# Patient Record
Sex: Female | Born: 1950 | Race: Black or African American | Hispanic: No | Marital: Married
Health system: Southern US, Community
[De-identification: ages and names within clinical notes are randomized; demographics above are authoritative.]

---

## 2005-07-26 ENCOUNTER — Encounter: Admission: RE | Admit: 2005-07-26 | Discharge: 2005-07-26 | Payer: Self-pay | Admitting: General Practice

## 2005-07-26 ENCOUNTER — Encounter (INDEPENDENT_AMBULATORY_CARE_PROVIDER_SITE_OTHER): Payer: Self-pay | Admitting: *Deleted

## 2005-07-26 ENCOUNTER — Encounter (INDEPENDENT_AMBULATORY_CARE_PROVIDER_SITE_OTHER): Payer: Self-pay | Admitting: Diagnostic Radiology

## 2005-08-07 ENCOUNTER — Encounter: Admission: RE | Admit: 2005-08-07 | Discharge: 2005-08-07 | Payer: Self-pay | Admitting: General Practice

## 2005-08-20 ENCOUNTER — Ambulatory Visit (HOSPITAL_COMMUNITY): Admission: RE | Admit: 2005-08-20 | Discharge: 2005-08-21 | Payer: Self-pay | Admitting: General Surgery

## 2005-08-20 ENCOUNTER — Encounter (INDEPENDENT_AMBULATORY_CARE_PROVIDER_SITE_OTHER): Payer: Self-pay | Admitting: *Deleted

## 2005-08-21 ENCOUNTER — Ambulatory Visit: Payer: Self-pay | Admitting: Oncology

## 2005-09-07 ENCOUNTER — Ambulatory Visit (HOSPITAL_COMMUNITY): Admission: RE | Admit: 2005-09-07 | Discharge: 2005-09-07 | Payer: Self-pay | Admitting: Oncology

## 2005-09-17 ENCOUNTER — Ambulatory Visit (HOSPITAL_COMMUNITY): Admission: RE | Admit: 2005-09-17 | Discharge: 2005-09-17 | Payer: Self-pay | Admitting: Oncology

## 2005-09-18 ENCOUNTER — Ambulatory Visit: Admission: RE | Admit: 2005-09-18 | Discharge: 2005-09-18 | Payer: Self-pay | Admitting: Oncology

## 2005-09-18 ENCOUNTER — Encounter (INDEPENDENT_AMBULATORY_CARE_PROVIDER_SITE_OTHER): Payer: Self-pay | Admitting: Cardiology

## 2005-10-02 ENCOUNTER — Encounter: Admission: RE | Admit: 2005-10-02 | Discharge: 2005-12-31 | Payer: Self-pay | Admitting: General Surgery

## 2005-10-08 ENCOUNTER — Ambulatory Visit: Payer: Self-pay | Admitting: Oncology

## 2005-10-19 ENCOUNTER — Ambulatory Visit (HOSPITAL_COMMUNITY): Admission: RE | Admit: 2005-10-19 | Discharge: 2005-10-19 | Payer: Self-pay | Admitting: Oncology

## 2005-10-23 ENCOUNTER — Inpatient Hospital Stay (HOSPITAL_COMMUNITY): Admission: EM | Admit: 2005-10-23 | Discharge: 2005-10-29 | Payer: Self-pay | Admitting: Oncology

## 2005-10-24 ENCOUNTER — Ambulatory Visit: Payer: Self-pay | Admitting: Oncology

## 2005-11-16 ENCOUNTER — Observation Stay (HOSPITAL_COMMUNITY): Admission: EM | Admit: 2005-11-16 | Discharge: 2005-11-16 | Payer: Self-pay | Admitting: Oncology

## 2005-11-26 ENCOUNTER — Ambulatory Visit: Payer: Self-pay | Admitting: Oncology

## 2005-12-10 ENCOUNTER — Ambulatory Visit (HOSPITAL_COMMUNITY): Admission: RE | Admit: 2005-12-10 | Discharge: 2005-12-10 | Payer: Self-pay | Admitting: Oncology

## 2005-12-28 ENCOUNTER — Ambulatory Visit (HOSPITAL_COMMUNITY): Admission: RE | Admit: 2005-12-28 | Discharge: 2005-12-28 | Payer: Self-pay | Admitting: Surgery

## 2005-12-28 ENCOUNTER — Encounter (INDEPENDENT_AMBULATORY_CARE_PROVIDER_SITE_OTHER): Payer: Self-pay | Admitting: *Deleted

## 2006-01-16 ENCOUNTER — Ambulatory Visit: Payer: Self-pay | Admitting: Oncology

## 2006-01-22 ENCOUNTER — Encounter: Admission: RE | Admit: 2006-01-22 | Discharge: 2006-03-22 | Payer: Self-pay | Admitting: General Surgery

## 2006-02-08 ENCOUNTER — Ambulatory Visit: Admission: RE | Admit: 2006-02-08 | Discharge: 2006-02-08 | Payer: Self-pay | Admitting: Oncology

## 2006-02-21 LAB — CBC WITH DIFFERENTIAL/PLATELET
BASO%: 2.1 % — ABNORMAL HIGH (ref 0.0–2.0)
EOS%: 5.6 % (ref 0.0–7.0)
LYMPH%: 74.8 % — ABNORMAL HIGH (ref 14.0–48.0)
MCH: 32.9 pg (ref 26.0–34.0)
MCHC: 35.6 g/dL (ref 32.0–36.0)
MONO#: 0.1 10*3/uL (ref 0.1–0.9)
MONO%: 5.3 % (ref 0.0–13.0)
Platelets: 64 10*3/uL — ABNORMAL LOW (ref 145–400)
RBC: 4.38 10*6/uL (ref 3.70–5.32)
WBC: 1.3 10*3/uL — ABNORMAL LOW (ref 3.9–10.0)

## 2006-02-21 LAB — PROTIME-INR: INR: 3.5 (ref 2.00–3.50)

## 2006-02-28 LAB — PROTIME-INR
INR: 3.4 (ref 2.00–3.50)
Protime: 22.3 Seconds — ABNORMAL HIGH (ref 10.6–13.4)

## 2006-02-28 LAB — CBC WITH DIFFERENTIAL/PLATELET
BASO%: 0.9 % (ref 0.0–2.0)
EOS%: 0.5 % (ref 0.0–7.0)
HCT: 41.6 % (ref 34.8–46.6)
MCHC: 34.2 g/dL (ref 32.0–36.0)
MONO#: 1 10*3/uL — ABNORMAL HIGH (ref 0.1–0.9)
RBC: 4.39 10*6/uL (ref 3.70–5.32)
RDW: 15.1 % — ABNORMAL HIGH (ref 11.3–14.5)
WBC: 9.1 10*3/uL (ref 3.9–10.0)
lymph#: 1.8 10*3/uL (ref 0.9–3.3)

## 2006-03-05 ENCOUNTER — Ambulatory Visit: Admission: RE | Admit: 2006-03-05 | Discharge: 2006-05-07 | Payer: Self-pay | Admitting: Radiation Oncology

## 2006-03-06 ENCOUNTER — Ambulatory Visit: Payer: Self-pay | Admitting: Oncology

## 2006-03-06 LAB — CBC WITH DIFFERENTIAL/PLATELET
BASO%: 0.8 % (ref 0.0–2.0)
EOS%: 0.5 % (ref 0.0–7.0)
MCH: 32.8 pg (ref 26.0–34.0)
MCHC: 34.3 g/dL (ref 32.0–36.0)
MONO#: 1.1 10*3/uL — ABNORMAL HIGH (ref 0.1–0.9)
RBC: 4.22 10*6/uL (ref 3.70–5.32)
WBC: 9 10*3/uL (ref 3.9–10.0)
lymph#: 1.8 10*3/uL (ref 0.9–3.3)

## 2006-03-06 LAB — PROTIME-INR: Protime: 14 Seconds — ABNORMAL HIGH (ref 10.6–13.4)

## 2006-03-14 LAB — CBC WITH DIFFERENTIAL/PLATELET
BASO%: 0.3 % (ref 0.0–2.0)
EOS%: 2.5 % (ref 0.0–7.0)
LYMPH%: 16.2 % (ref 14.0–48.0)
MCHC: 33.7 g/dL (ref 32.0–36.0)
MCV: 95.3 fL (ref 81.0–101.0)
MONO%: 10.4 % (ref 0.0–13.0)
Platelets: 232 10*3/uL (ref 145–400)
RBC: 4.65 10*6/uL (ref 3.70–5.32)
RDW: 16.3 % — ABNORMAL HIGH (ref 11.3–14.5)

## 2006-03-14 LAB — COMPREHENSIVE METABOLIC PANEL
Albumin: 4 g/dL (ref 3.5–5.2)
Alkaline Phosphatase: 106 U/L (ref 39–117)
BUN: 16 mg/dL (ref 6–23)
Creatinine, Ser: 0.8 mg/dL (ref 0.4–1.2)
Glucose, Bld: 96 mg/dL (ref 70–99)
Total Bilirubin: 0.4 mg/dL (ref 0.3–1.2)

## 2006-03-14 LAB — PROTIME-INR
INR: 1.7 — ABNORMAL LOW (ref 2.00–3.50)
Protime: 16.2 Seconds — ABNORMAL HIGH (ref 10.6–13.4)

## 2006-03-21 LAB — CBC WITH DIFFERENTIAL/PLATELET
BASO%: 0.9 % (ref 0.0–2.0)
EOS%: 6 % (ref 0.0–7.0)
HCT: 42.2 % (ref 34.8–46.6)
LYMPH%: 17.8 % (ref 14.0–48.0)
MCH: 33 pg (ref 26.0–34.0)
MCHC: 35.1 g/dL (ref 32.0–36.0)
MONO#: 0.7 10*3/uL (ref 0.1–0.9)
NEUT%: 65.9 % (ref 39.6–76.8)
Platelets: 241 10*3/uL (ref 145–400)

## 2006-03-28 LAB — CBC WITH DIFFERENTIAL/PLATELET
BASO%: 0.4 % (ref 0.0–2.0)
Basophils Absolute: 0.1 10*3/uL (ref 0.0–0.1)
HCT: 43.6 % (ref 34.8–46.6)
LYMPH%: 9.9 % — ABNORMAL LOW (ref 14.0–48.0)
MCHC: 36.3 g/dL — ABNORMAL HIGH (ref 32.0–36.0)
MONO#: 0.8 10*3/uL (ref 0.1–0.9)
NEUT%: 78.4 % — ABNORMAL HIGH (ref 39.6–76.8)
Platelets: 200 10*3/uL (ref 145–400)
WBC: 11.3 10*3/uL — ABNORMAL HIGH (ref 3.9–10.0)

## 2006-04-04 LAB — CBC WITH DIFFERENTIAL/PLATELET
Basophils Absolute: 0.1 10*3/uL (ref 0.0–0.1)
Eosinophils Absolute: 0.4 10*3/uL (ref 0.0–0.5)
HCT: 43.7 % (ref 34.8–46.6)
HGB: 15 g/dL (ref 11.6–15.9)
LYMPH%: 10.5 % — ABNORMAL LOW (ref 14.0–48.0)
MONO#: 0.8 10*3/uL (ref 0.1–0.9)
NEUT#: 7.5 10*3/uL — ABNORMAL HIGH (ref 1.5–6.5)
NEUT%: 77.2 % — ABNORMAL HIGH (ref 39.6–76.8)
Platelets: 193 10*3/uL (ref 145–400)
WBC: 9.7 10*3/uL (ref 3.9–10.0)

## 2006-04-04 LAB — PROTIME-INR: INR: 1.4 — ABNORMAL LOW (ref 2.00–3.50)

## 2006-04-11 LAB — CBC WITH DIFFERENTIAL/PLATELET
BASO%: 0.9 % (ref 0.0–2.0)
Basophils Absolute: 0.1 10*3/uL (ref 0.0–0.1)
EOS%: 4.3 % (ref 0.0–7.0)
HGB: 15.3 g/dL (ref 11.6–15.9)
MCH: 31.9 pg (ref 26.0–34.0)
MONO%: 10.1 % (ref 0.0–13.0)
RBC: 4.78 10*6/uL (ref 3.70–5.32)
RDW: 13.2 % (ref 11.3–14.5)
lymph#: 0.9 10*3/uL (ref 0.9–3.3)

## 2006-04-11 LAB — PROTIME-INR
INR: 1.5 — ABNORMAL LOW (ref 2.00–3.50)
Protime: 15.1 Seconds — ABNORMAL HIGH (ref 10.6–13.4)

## 2006-04-13 ENCOUNTER — Ambulatory Visit: Payer: Self-pay | Admitting: Oncology

## 2006-04-18 LAB — CBC WITH DIFFERENTIAL/PLATELET
Basophils Absolute: 0 10*3/uL (ref 0.0–0.1)
Eosinophils Absolute: 0.2 10*3/uL (ref 0.0–0.5)
HGB: 15.2 g/dL (ref 11.6–15.9)
MONO#: 0.6 10*3/uL (ref 0.1–0.9)
NEUT#: 5 10*3/uL (ref 1.5–6.5)
RBC: 4.84 10*6/uL (ref 3.70–5.32)
RDW: 12.6 % (ref 11.3–14.5)
WBC: 7 10*3/uL (ref 3.9–10.0)
lymph#: 1.1 10*3/uL (ref 0.9–3.3)

## 2006-04-18 LAB — PROTIME-INR
INR: 2 (ref 2.00–3.50)
Protime: 17.3 Seconds — ABNORMAL HIGH (ref 10.6–13.4)

## 2006-04-25 LAB — CBC WITH DIFFERENTIAL/PLATELET
BASO%: 0.6 % (ref 0.0–2.0)
Eosinophils Absolute: 0.2 10*3/uL (ref 0.0–0.5)
HCT: 46.2 % (ref 34.8–46.6)
LYMPH%: 17.7 % (ref 14.0–48.0)
MCHC: 33.8 g/dL (ref 32.0–36.0)
MONO#: 0.7 10*3/uL (ref 0.1–0.9)
NEUT#: 4.9 10*3/uL (ref 1.5–6.5)
Platelets: 193 10*3/uL (ref 145–400)
RBC: 4.84 10*6/uL (ref 3.70–5.32)
WBC: 7.1 10*3/uL (ref 3.9–10.0)
lymph#: 1.3 10*3/uL (ref 0.9–3.3)

## 2006-04-25 LAB — PROTIME-INR: Protime: 17.9 Seconds — ABNORMAL HIGH (ref 10.6–13.4)

## 2006-05-01 ENCOUNTER — Encounter: Admission: RE | Admit: 2006-05-01 | Discharge: 2006-05-01 | Payer: Self-pay | Admitting: Oncology

## 2006-05-02 LAB — PROTIME-INR
INR: 2.4 (ref 2.00–3.50)
Protime: 18.7 Seconds — ABNORMAL HIGH (ref 10.6–13.4)

## 2006-05-06 ENCOUNTER — Encounter: Admission: RE | Admit: 2006-05-06 | Discharge: 2006-06-26 | Payer: Self-pay | Admitting: Radiation Oncology

## 2006-05-08 LAB — COMPREHENSIVE METABOLIC PANEL
AST: 18 U/L (ref 0–37)
Albumin: 3.8 g/dL (ref 3.5–5.2)
Alkaline Phosphatase: 132 U/L — ABNORMAL HIGH (ref 39–117)
BUN: 12 mg/dL (ref 6–23)
Potassium: 3.4 mEq/L — ABNORMAL LOW (ref 3.5–5.3)
Sodium: 139 mEq/L (ref 135–145)

## 2006-05-08 LAB — CBC WITH DIFFERENTIAL/PLATELET
Eosinophils Absolute: 0.3 10*3/uL (ref 0.0–0.5)
MONO#: 0.5 10*3/uL (ref 0.1–0.9)
MONO%: 7.7 % (ref 0.0–13.0)
NEUT#: 4.5 10*3/uL (ref 1.5–6.5)
RBC: 4.79 10*6/uL (ref 3.70–5.32)
RDW: 13.9 % (ref 11.3–14.5)
WBC: 6.9 10*3/uL (ref 3.9–10.0)
lymph#: 1.5 10*3/uL (ref 0.9–3.3)

## 2006-05-08 LAB — PROTIME-INR
INR: 1.7 — ABNORMAL LOW (ref 2.00–3.50)
Protime: 15.9 Seconds — ABNORMAL HIGH (ref 10.6–13.4)

## 2006-05-09 LAB — FOLLICLE STIMULATING HORMONE: FSH: 65.2 m[IU]/mL

## 2006-05-13 LAB — ESTRADIOL, ULTRA SENS: Estradiol, Ultra Sensitive: 10 pg/mL

## 2006-05-28 ENCOUNTER — Ambulatory Visit (HOSPITAL_BASED_OUTPATIENT_CLINIC_OR_DEPARTMENT_OTHER): Admission: RE | Admit: 2006-05-28 | Discharge: 2006-05-28 | Payer: Self-pay | Admitting: General Surgery

## 2006-07-18 ENCOUNTER — Ambulatory Visit: Payer: Self-pay | Admitting: Oncology

## 2006-07-19 LAB — CBC WITH DIFFERENTIAL/PLATELET
Basophils Absolute: 0 10*3/uL (ref 0.0–0.1)
Eosinophils Absolute: 0.2 10*3/uL (ref 0.0–0.5)
HCT: 45 % (ref 34.8–46.6)
HGB: 15.4 g/dL (ref 11.6–15.9)
MCH: 31.4 pg (ref 26.0–34.0)
NEUT#: 5.7 10*3/uL (ref 1.5–6.5)
NEUT%: 74.2 % (ref 39.6–76.8)
RDW: 13.7 % (ref 11.3–14.5)
lymph#: 1.4 10*3/uL (ref 0.9–3.3)

## 2006-07-19 LAB — COMPREHENSIVE METABOLIC PANEL
Alkaline Phosphatase: 122 U/L — ABNORMAL HIGH (ref 39–117)
BUN: 11 mg/dL (ref 6–23)
CO2: 19 mEq/L (ref 19–32)
Glucose, Bld: 120 mg/dL — ABNORMAL HIGH (ref 70–99)
Total Bilirubin: 0.4 mg/dL (ref 0.3–1.2)

## 2006-07-19 LAB — LACTATE DEHYDROGENASE: LDH: 178 U/L (ref 94–250)

## 2006-07-29 ENCOUNTER — Encounter: Admission: RE | Admit: 2006-07-29 | Discharge: 2006-07-29 | Payer: Self-pay | Admitting: General Surgery

## 2006-10-17 ENCOUNTER — Ambulatory Visit: Payer: Self-pay | Admitting: Oncology

## 2006-10-22 LAB — LACTATE DEHYDROGENASE: LDH: 165 U/L (ref 94–250)

## 2006-10-22 LAB — COMPREHENSIVE METABOLIC PANEL
ALT: 18 U/L (ref 0–35)
Alkaline Phosphatase: 128 U/L — ABNORMAL HIGH (ref 39–117)
CO2: 24 mEq/L (ref 19–32)
Creatinine, Ser: 0.8 mg/dL (ref 0.40–1.20)
Glucose, Bld: 106 mg/dL — ABNORMAL HIGH (ref 70–99)
Sodium: 141 mEq/L (ref 135–145)
Total Bilirubin: 0.5 mg/dL (ref 0.3–1.2)
Total Protein: 7.1 g/dL (ref 6.0–8.3)

## 2006-10-22 LAB — CBC WITH DIFFERENTIAL/PLATELET
BASO%: 0 % (ref 0.0–2.0)
HCT: 46.1 % (ref 34.8–46.6)
LYMPH%: 15 % (ref 14.0–48.0)
MCHC: 33.8 g/dL (ref 32.0–36.0)
MCV: 93 fL (ref 81.0–101.0)
MONO%: 2.8 % (ref 0.0–13.0)
NEUT%: 80.1 % — ABNORMAL HIGH (ref 39.6–76.8)
Platelets: 257 10*3/uL (ref 145–400)
RBC: 4.95 10*6/uL (ref 3.70–5.32)

## 2006-10-22 LAB — CANCER ANTIGEN 27.29: CA 27.29: 11 U/mL (ref 0–39)

## 2007-02-17 ENCOUNTER — Ambulatory Visit: Payer: Self-pay | Admitting: Oncology

## 2007-02-20 LAB — CBC WITH DIFFERENTIAL/PLATELET
Basophils Absolute: 0 10*3/uL (ref 0.0–0.1)
Eosinophils Absolute: 0.2 10*3/uL (ref 0.0–0.5)
HCT: 44.9 % (ref 34.8–46.6)
HGB: 15.8 g/dL (ref 11.6–15.9)
MCV: 90.3 fL (ref 81.0–101.0)
MONO%: 5.3 % (ref 0.0–13.0)
NEUT#: 7.2 10*3/uL — ABNORMAL HIGH (ref 1.5–6.5)
NEUT%: 73.9 % (ref 39.6–76.8)
Platelets: 229 10*3/uL (ref 145–400)
RDW: 13.6 % (ref 11.3–14.5)

## 2007-02-20 LAB — COMPREHENSIVE METABOLIC PANEL
Albumin: 4.2 g/dL (ref 3.5–5.2)
Alkaline Phosphatase: 135 U/L — ABNORMAL HIGH (ref 39–117)
BUN: 15 mg/dL (ref 6–23)
Calcium: 9.7 mg/dL (ref 8.4–10.5)
Chloride: 105 mEq/L (ref 96–112)
Creatinine, Ser: 0.81 mg/dL (ref 0.40–1.20)
Glucose, Bld: 100 mg/dL — ABNORMAL HIGH (ref 70–99)
Potassium: 4 mEq/L (ref 3.5–5.3)

## 2007-02-20 LAB — CANCER ANTIGEN 27.29: CA 27.29: 15 U/mL (ref 0–39)

## 2007-02-24 ENCOUNTER — Ambulatory Visit (HOSPITAL_COMMUNITY): Admission: RE | Admit: 2007-02-24 | Discharge: 2007-02-24 | Payer: Self-pay | Admitting: Oncology

## 2007-04-08 ENCOUNTER — Encounter: Admission: RE | Admit: 2007-04-08 | Discharge: 2007-07-02 | Payer: Self-pay | Admitting: General Surgery

## 2007-06-27 ENCOUNTER — Ambulatory Visit: Payer: Self-pay | Admitting: Oncology

## 2007-07-08 ENCOUNTER — Ambulatory Visit (HOSPITAL_BASED_OUTPATIENT_CLINIC_OR_DEPARTMENT_OTHER): Admission: RE | Admit: 2007-07-08 | Discharge: 2007-07-08 | Payer: Self-pay | Admitting: General Surgery

## 2007-07-08 ENCOUNTER — Encounter (INDEPENDENT_AMBULATORY_CARE_PROVIDER_SITE_OTHER): Payer: Self-pay | Admitting: General Surgery

## 2007-07-24 LAB — CBC WITH DIFFERENTIAL/PLATELET
BASO%: 0.4 % (ref 0.0–2.0)
EOS%: 2 % (ref 0.0–7.0)
HCT: 45.8 % (ref 34.8–46.6)
MCH: 31.6 pg (ref 26.0–34.0)
MCHC: 34.8 g/dL (ref 32.0–36.0)
MCV: 90.8 fL (ref 81.0–101.0)
MONO%: 5.1 % (ref 0.0–13.0)
NEUT%: 76.3 % (ref 39.6–76.8)
lymph#: 1.6 10*3/uL (ref 0.9–3.3)

## 2007-07-24 LAB — COMPREHENSIVE METABOLIC PANEL
ALT: 17 U/L (ref 0–35)
AST: 17 U/L (ref 0–37)
Alkaline Phosphatase: 152 U/L — ABNORMAL HIGH (ref 39–117)
Chloride: 106 mEq/L (ref 96–112)
Creatinine, Ser: 0.77 mg/dL (ref 0.40–1.20)
Total Bilirubin: 0.5 mg/dL (ref 0.3–1.2)

## 2007-08-06 ENCOUNTER — Encounter (INDEPENDENT_AMBULATORY_CARE_PROVIDER_SITE_OTHER): Payer: Self-pay | Admitting: General Surgery

## 2007-08-06 ENCOUNTER — Ambulatory Visit (HOSPITAL_BASED_OUTPATIENT_CLINIC_OR_DEPARTMENT_OTHER): Admission: RE | Admit: 2007-08-06 | Discharge: 2007-08-06 | Payer: Self-pay | Admitting: General Surgery

## 2007-08-18 ENCOUNTER — Encounter: Admission: RE | Admit: 2007-08-18 | Discharge: 2007-08-18 | Payer: Self-pay | Admitting: General Surgery

## 2007-08-21 ENCOUNTER — Ambulatory Visit (HOSPITAL_COMMUNITY): Admission: RE | Admit: 2007-08-21 | Discharge: 2007-08-21 | Payer: Self-pay | Admitting: Oncology

## 2007-12-11 ENCOUNTER — Ambulatory Visit: Payer: Self-pay | Admitting: Oncology

## 2007-12-15 LAB — CBC WITH DIFFERENTIAL/PLATELET
BASO%: 0.8 % (ref 0.0–2.0)
Eosinophils Absolute: 0.3 10*3/uL (ref 0.0–0.5)
HCT: 46.3 % (ref 34.8–46.6)
HGB: 16.7 g/dL — ABNORMAL HIGH (ref 11.6–15.9)
LYMPH%: 19.6 % (ref 14.0–48.0)
MCHC: 36 g/dL (ref 32.0–36.0)
MONO#: 0.6 10*3/uL (ref 0.1–0.9)
NEUT%: 70.7 % (ref 39.6–76.8)
Platelets: 241 10*3/uL (ref 145–400)
lymph#: 1.9 10*3/uL (ref 0.9–3.3)

## 2007-12-15 LAB — LACTATE DEHYDROGENASE: LDH: 153 U/L (ref 94–250)

## 2007-12-15 LAB — COMPREHENSIVE METABOLIC PANEL
ALT: 21 U/L (ref 0–35)
CO2: 20 mEq/L (ref 19–32)
Calcium: 9.7 mg/dL (ref 8.4–10.5)
Chloride: 106 mEq/L (ref 96–112)
Creatinine, Ser: 0.82 mg/dL (ref 0.40–1.20)
Glucose, Bld: 144 mg/dL — ABNORMAL HIGH (ref 70–99)
Sodium: 140 mEq/L (ref 135–145)
Total Bilirubin: 0.4 mg/dL (ref 0.3–1.2)
Total Protein: 6.9 g/dL (ref 6.0–8.3)

## 2007-12-15 LAB — CANCER ANTIGEN 27.29: CA 27.29: 18 U/mL (ref 0–39)

## 2008-01-12 ENCOUNTER — Encounter: Admission: RE | Admit: 2008-01-12 | Discharge: 2008-01-12 | Payer: Self-pay | Admitting: Oncology

## 2008-03-22 IMAGING — CT CT ABDOMEN W/ CM
2 of 5 series · 15 of 46 positions shown, 17 images · IV contrast (omnipaque)
Comparison: Plain film chest 12/10/05, CT PET 09/17/05, and diagnostic CTs chest, abdomen and pelvis 09/07/05.

CLINICAL DATA: Restaging breast cancer.  History of left-sided breast cancer.  Left axillary pain.  Right lower quadrant pain.  Oral chemotherapy in progress. 
CHEST CT WITH CONTRAST:
TECHNIQUE: Multidetector CT imaging of the chest was performed following the standard protocol during bolus administration of intravenous contrast.
Contrast:  125 cc Omnipaque 300
TECHNIQUE: Multidetector CT imaging of the abdomen was performed following the standard protocol during bolus administration of intravenous contrast.
TECHNIQUE: Multidetector CT imaging of the pelvis was performed following the standard protocol during bolus administration of intravenous contrast.

[Series 2: cap 5.0 b40f · axial · 0.77mm/px · z∈[-620,-75]mm · 12 of 123 slices shown, 14 images]
[im 7/123  soft-tissue]
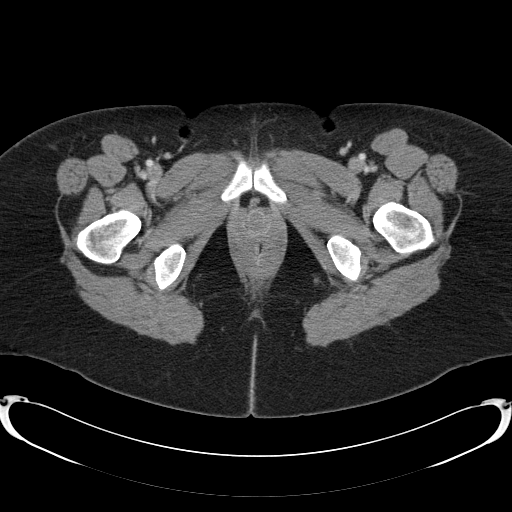
[im 7/123  bone]
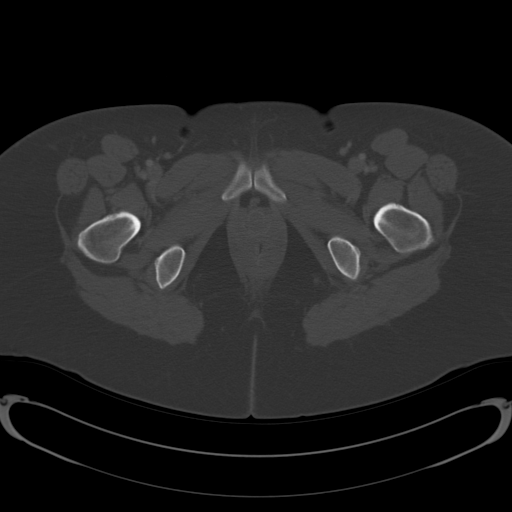
[im 21/123  soft-tissue]
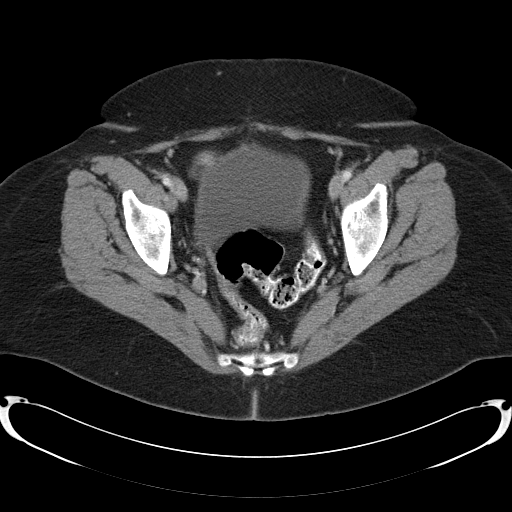
[im 28/123  soft-tissue]
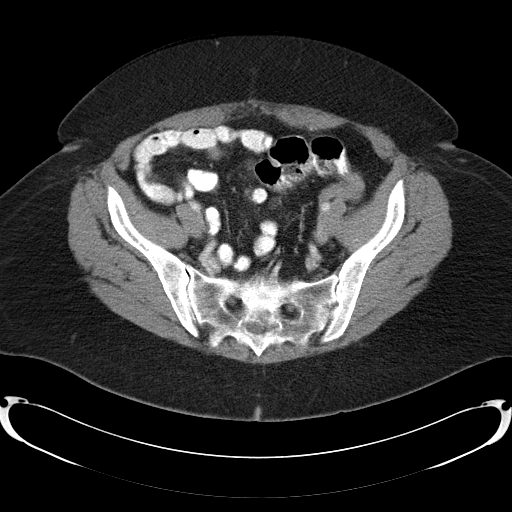
[im 34/123  soft-tissue]
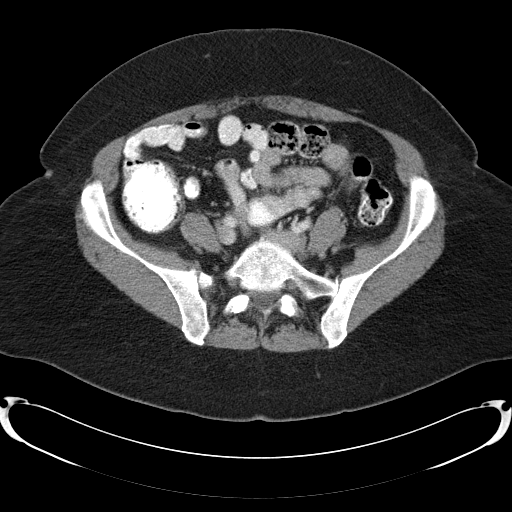
[im 48/123  soft-tissue]
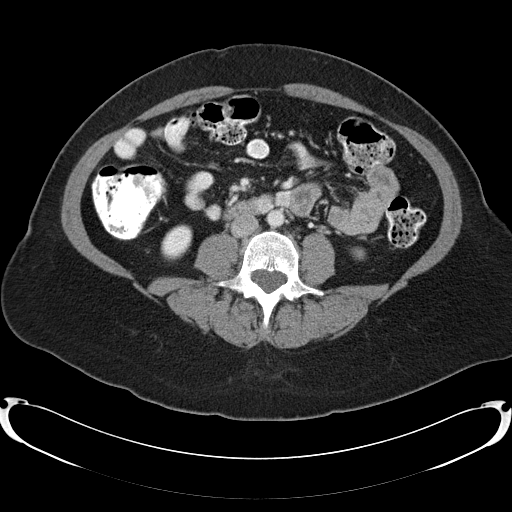
[im 55/123  soft-tissue]
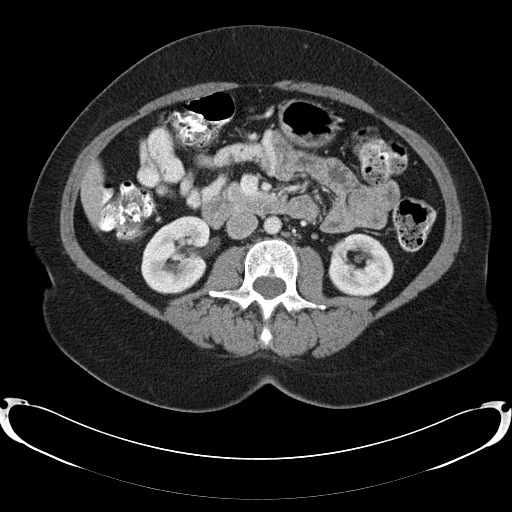
[im 68/123  soft-tissue]
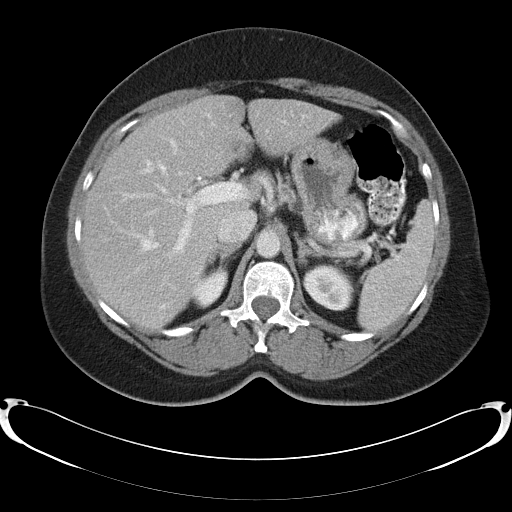
[im 75/123  soft-tissue]
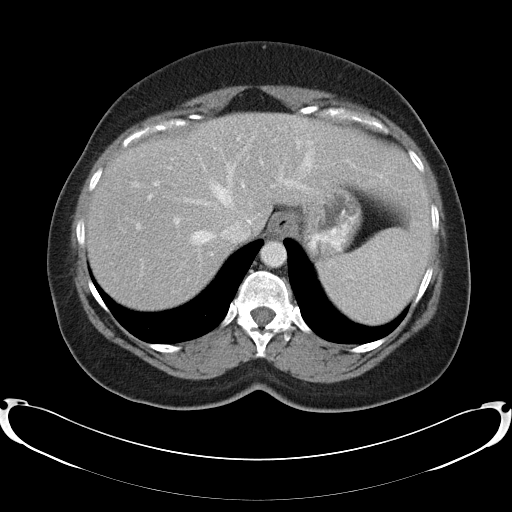
[im 89/123  soft-tissue]
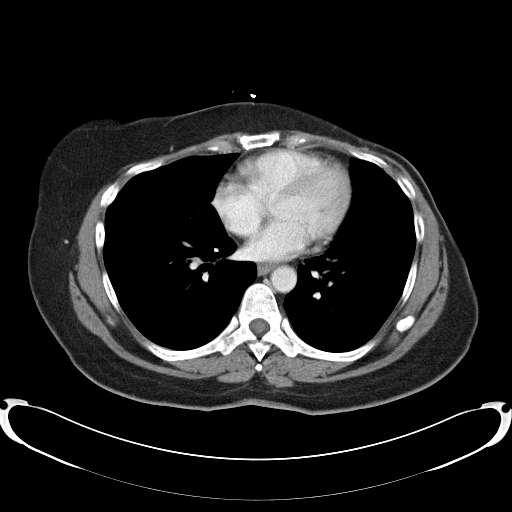
[im 89/123  bone]
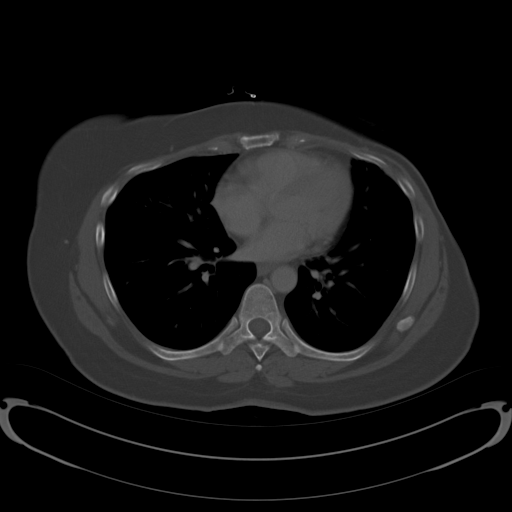
[im 95/123  soft-tissue]
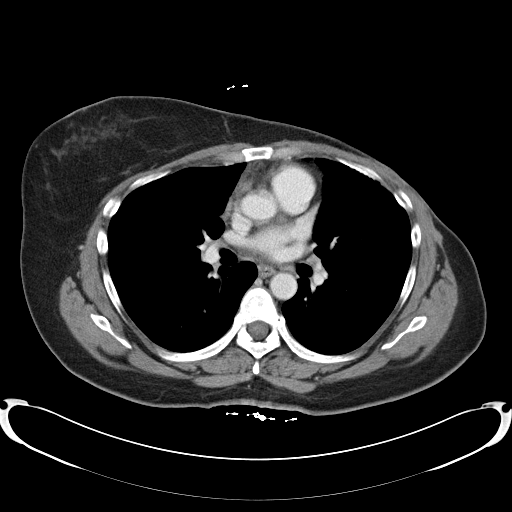
[im 102/123  soft-tissue]
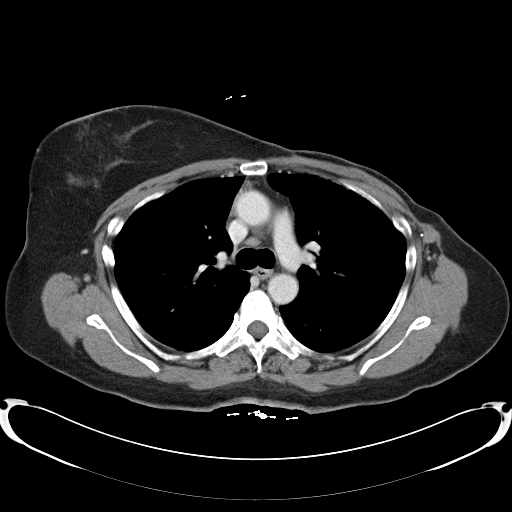
[im 116/123  soft-tissue]
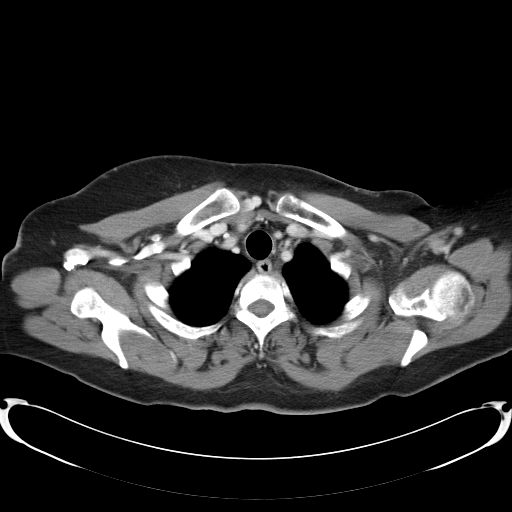

[Series 602: coronal iamges · coronal · 1.20mm/px · 3 of 86 slices shown]
[im 29/86  soft-tissue]
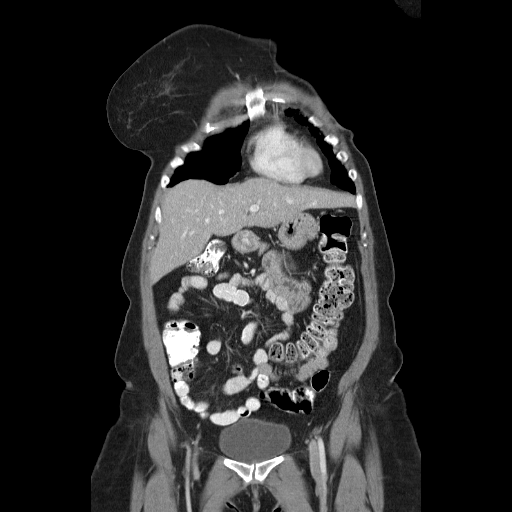
[im 38/86  soft-tissue]
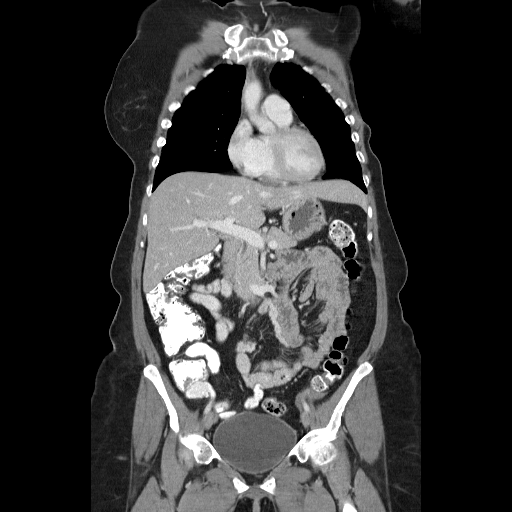
[im 48/86  soft-tissue]
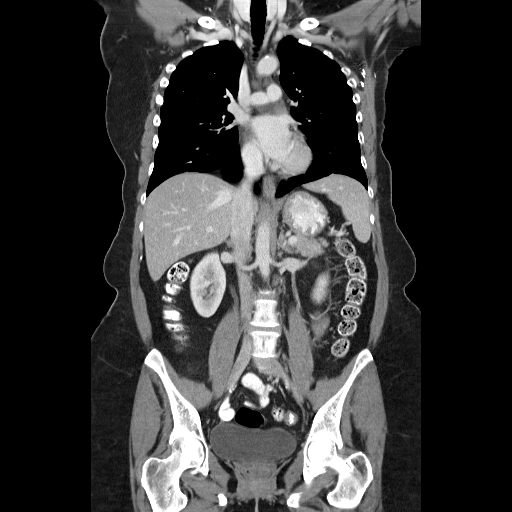

[15 of 46 positions shown; findings below may reference images not displayed]

FINDINGS: Lung windows demonstrate a 1 to 2 mm right upper lobe lung nodule which is stable on image 23.  Radiation changes in the peripheral left upper lobe.  Stable 2 mm left upper lobe lung nodule on image 22.  Probable subpleural lymph node is stable along the left major fissure on image 24 measures only 1 mm.  
The acute thrombus described in the right internal jugular vein is no longer present.  The right IJ is however, not well visualized and likely chronically thrombosed.  The Port-A-Cath has been removed.  Patient is status-post left-sided mastectomy.  Left axillary nodal dissection.  The fluid collection in the left axillary region has resolved.  No axillary adenopathy.  
The heart size is normal.  There is no pericardial or pleural effusion.  Small middle mediastinal lymph nodes are stable.  Again demonstrated is prominent right infrahilar nodal tissue without adenopathy.  Borderline right hilar adenopathy at approximately 1 cm on image 25 is felt to be similar to on the prior exam.  No left hilar adenopathy.  
  There is an AP window lymph node which measures 1.0 x 0.9 cm on image 20.  This is felt to be similar to 1.2 x 0.9 cm previously.  There are small prevascular lymph nodes which are slightly increased in size since the prior exam (image 20).  None are pathologic by CT size criteria.  No internal mammary adenopathy.  
Bone windows demonstrate no worrisome osseous lesion.
IMPRESSION: 1.  No evidence of metastatic disease within the chest.  Status-post left axillary nodal dissection and mastectomy with resolved postoperative fluid collection.  
2.  Lack of visualization of the right internal jugular vein likely due to chronic thrombus.  
3.  Small prevascular and AP window lymph nodes are not pathologic by size criteria.  The former nodes are slightly increased in conspicuity since the prior exam and warrant attention on followup. 
ABDOMEN CT WITH CONTRAST:
FINDINGS: Prior cholecystectomy.  Previously described left liver lobe possible low density lesion is not appreciated and may have been artifactual.  The spleen, stomach, pancreas are normal.  Right adrenal gland is mildly thickened.  Left adrenal gland is unremarkable.  
Kidneys normal.  No retroperitoneal or retrocrural adenopathy.  
Colon and terminal ileum are normal.  The small bowel is normal and there is no ascites.
IMPRESSION: No acute process or evidence of metastatic disease within the abdomen.  The previously described left liver lobe low density focus is not appreciated on today?s exam and may have been artifactual. 
PELVIS CT WITH CONTRAST:
FINDINGS: Pelvic large and small bowel are normal.  Prior hysterectomy.  Urinary bladder and vaginal cuff are normal.  
Bone windows demonstrate a left iliac lucent lesion which is stable on image 93.
IMPRESSION: Hysterectomy but no evidence of metastatic disease within the pelvis.

## 2008-06-22 ENCOUNTER — Ambulatory Visit: Payer: Self-pay | Admitting: Oncology

## 2008-07-13 LAB — CBC WITH DIFFERENTIAL/PLATELET
Basophils Absolute: 0 10*3/uL (ref 0.0–0.1)
EOS%: 3.1 % (ref 0.0–7.0)
Eosinophils Absolute: 0.3 10*3/uL (ref 0.0–0.5)
HCT: 45.2 % (ref 34.8–46.6)
HGB: 15.7 g/dL (ref 11.6–15.9)
MCH: 31.5 pg (ref 26.0–34.0)
MCV: 90.9 fL (ref 81.0–101.0)
MONO%: 3.9 % (ref 0.0–13.0)
NEUT#: 6.8 10*3/uL — ABNORMAL HIGH (ref 1.5–6.5)
NEUT%: 71.8 % (ref 39.6–76.8)
Platelets: 223 10*3/uL (ref 145–400)
RDW: 13.4 % (ref 11.3–14.5)

## 2008-07-13 LAB — COMPREHENSIVE METABOLIC PANEL
Albumin: 3.9 g/dL (ref 3.5–5.2)
Alkaline Phosphatase: 150 U/L — ABNORMAL HIGH (ref 39–117)
BUN: 12 mg/dL (ref 6–23)
Calcium: 9.2 mg/dL (ref 8.4–10.5)
Creatinine, Ser: 1 mg/dL (ref 0.40–1.20)
Glucose, Bld: 119 mg/dL — ABNORMAL HIGH (ref 70–99)
Potassium: 3.6 mEq/L (ref 3.5–5.3)

## 2008-08-18 ENCOUNTER — Encounter: Admission: RE | Admit: 2008-08-18 | Discharge: 2008-08-18 | Payer: Self-pay | Admitting: Oncology

## 2008-08-18 ENCOUNTER — Ambulatory Visit: Payer: Self-pay | Admitting: Oncology

## 2009-03-02 ENCOUNTER — Ambulatory Visit: Payer: Self-pay | Admitting: Oncology

## 2009-03-04 LAB — CBC WITH DIFFERENTIAL/PLATELET
BASO%: 0.3 % (ref 0.0–2.0)
Eosinophils Absolute: 0.3 10*3/uL (ref 0.0–0.5)
LYMPH%: 20.4 % (ref 14.0–49.7)
MCHC: 34.2 g/dL (ref 31.5–36.0)
MCV: 90.8 fL (ref 79.5–101.0)
MONO%: 3.8 % (ref 0.0–14.0)
NEUT#: 6.6 10*3/uL — ABNORMAL HIGH (ref 1.5–6.5)
Platelets: 231 10*3/uL (ref 145–400)
RBC: 5.24 10*6/uL (ref 3.70–5.45)
RDW: 13.2 % (ref 11.2–14.5)
WBC: 9.2 10*3/uL (ref 3.9–10.3)

## 2009-03-07 LAB — COMPREHENSIVE METABOLIC PANEL
ALT: 18 U/L (ref 0–35)
AST: 16 U/L (ref 0–37)
Albumin: 3.8 g/dL (ref 3.5–5.2)
Alkaline Phosphatase: 153 U/L — ABNORMAL HIGH (ref 39–117)
Glucose, Bld: 121 mg/dL — ABNORMAL HIGH (ref 70–99)
Potassium: 3.7 mEq/L (ref 3.5–5.3)
Sodium: 142 mEq/L (ref 135–145)
Total Bilirubin: 0.4 mg/dL (ref 0.3–1.2)
Total Protein: 6.7 g/dL (ref 6.0–8.3)

## 2009-09-14 ENCOUNTER — Ambulatory Visit: Payer: Self-pay | Admitting: Oncology

## 2009-10-19 ENCOUNTER — Ambulatory Visit: Payer: Self-pay | Admitting: Oncology

## 2009-10-21 LAB — COMPREHENSIVE METABOLIC PANEL
CO2: 28 mEq/L (ref 19–32)
Calcium: 9.4 mg/dL (ref 8.4–10.5)
Chloride: 106 mEq/L (ref 96–112)
Creatinine, Ser: 1.28 mg/dL — ABNORMAL HIGH (ref 0.40–1.20)
Glucose, Bld: 87 mg/dL (ref 70–99)
Total Bilirubin: 0.6 mg/dL (ref 0.3–1.2)
Total Protein: 7.2 g/dL (ref 6.0–8.3)

## 2009-10-21 LAB — CBC WITH DIFFERENTIAL/PLATELET
Eosinophils Absolute: 0.3 10*3/uL (ref 0.0–0.5)
HCT: 47.8 % — ABNORMAL HIGH (ref 34.8–46.6)
HGB: 16.1 g/dL — ABNORMAL HIGH (ref 11.6–15.9)
LYMPH%: 23 % (ref 14.0–49.7)
MONO#: 0.7 10*3/uL (ref 0.1–0.9)
NEUT#: 6.3 10*3/uL (ref 1.5–6.5)
NEUT%: 66.2 % (ref 38.4–76.8)
Platelets: 207 10*3/uL (ref 145–400)
WBC: 9.5 10*3/uL (ref 3.9–10.3)
lymph#: 2.2 10*3/uL (ref 0.9–3.3)

## 2009-10-21 LAB — LACTATE DEHYDROGENASE: LDH: 144 U/L (ref 94–250)

## 2009-10-22 LAB — CANCER ANTIGEN 27.29: CA 27.29: 15 U/mL (ref 0–39)

## 2009-11-04 ENCOUNTER — Ambulatory Visit (HOSPITAL_COMMUNITY): Admission: RE | Admit: 2009-11-04 | Discharge: 2009-11-04 | Payer: Self-pay | Admitting: Oncology

## 2009-11-15 ENCOUNTER — Encounter: Admission: RE | Admit: 2009-11-15 | Discharge: 2009-11-15 | Payer: Self-pay | Admitting: Oncology

## 2009-12-07 ENCOUNTER — Ambulatory Visit (HOSPITAL_COMMUNITY): Admission: RE | Admit: 2009-12-07 | Discharge: 2009-12-07 | Payer: Self-pay | Admitting: Oncology

## 2010-04-25 ENCOUNTER — Ambulatory Visit: Payer: Self-pay | Admitting: Oncology

## 2010-07-20 ENCOUNTER — Ambulatory Visit: Payer: Self-pay | Admitting: Oncology

## 2010-07-25 LAB — COMPREHENSIVE METABOLIC PANEL
ALT: 11 U/L (ref 0–35)
AST: 14 U/L (ref 0–37)
Alkaline Phosphatase: 109 U/L (ref 39–117)
CO2: 24 mEq/L (ref 19–32)
Sodium: 138 mEq/L (ref 135–145)
Total Bilirubin: 0.5 mg/dL (ref 0.3–1.2)
Total Protein: 6.4 g/dL (ref 6.0–8.3)

## 2010-07-25 LAB — LACTATE DEHYDROGENASE: LDH: 129 U/L (ref 94–250)

## 2010-07-25 LAB — CBC WITH DIFFERENTIAL/PLATELET
BASO%: 0.4 % (ref 0.0–2.0)
EOS%: 2.7 % (ref 0.0–7.0)
LYMPH%: 14.5 % (ref 14.0–49.7)
MCHC: 33.7 g/dL (ref 31.5–36.0)
MONO#: 0.6 10*3/uL (ref 0.1–0.9)
Platelets: 217 10*3/uL (ref 145–400)
RBC: 5.07 10*6/uL (ref 3.70–5.45)
WBC: 10.9 10*3/uL — ABNORMAL HIGH (ref 3.9–10.3)

## 2010-12-09 ENCOUNTER — Other Ambulatory Visit: Payer: Self-pay | Admitting: Oncology

## 2010-12-09 DIAGNOSIS — Z780 Asymptomatic menopausal state: Secondary | ICD-10-CM

## 2010-12-09 DIAGNOSIS — C50919 Malignant neoplasm of unspecified site of unspecified female breast: Secondary | ICD-10-CM

## 2010-12-09 DIAGNOSIS — Z1239 Encounter for other screening for malignant neoplasm of breast: Secondary | ICD-10-CM

## 2010-12-09 DIAGNOSIS — Z853 Personal history of malignant neoplasm of breast: Secondary | ICD-10-CM

## 2010-12-10 ENCOUNTER — Encounter: Payer: Self-pay | Admitting: General Surgery

## 2010-12-10 ENCOUNTER — Encounter: Payer: Self-pay | Admitting: Oncology

## 2011-01-08 ENCOUNTER — Ambulatory Visit: Payer: Self-pay

## 2011-01-08 ENCOUNTER — Other Ambulatory Visit: Payer: Self-pay

## 2011-02-08 ENCOUNTER — Ambulatory Visit: Payer: Self-pay

## 2011-02-08 ENCOUNTER — Other Ambulatory Visit: Payer: Self-pay

## 2011-04-03 NOTE — Op Note (Signed)
NAMEMarland Kitchen  Emily [name], Emily [name]              ACCOUNT NO.:  [id number]   MEDICAL RECORD NO.:  [id number]          PATIENT TYPE:  AMB   LOCATION:  DSC                          FACILITY:  [facility]   PHYSICIAN:  Angelia Mould. 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   OPERATION PERFORMED:  Wide local re-excision of granular cell tumor of  left upper back with layered closure (4.5 cm x 2.5 cm).   OPERATIVE INDICATIONS:  This is a 60 year old female who has a history  of breast cancer of the left breast status post mastectomy for  multifocal carcinoma.  She presented recently with what was thought to  be an infected cyst of her left upper back.  After initial treatment  with antibiotics, the lump persisted.  I excised this area  conservatively on July 08, 2007 and she has healed well, but the  pathology report shows a granular cell tumor with tumor present at the  lateral resection margin.  This histology has been reviewed and it is  felt that this is a potentially premalignant lesion and recurrence is  theoretically possible and re-excision is advised.  She is advised to  have this done and is brought to the operating room electively.   OPERATIVE TECHNIQUE:  The patient was brought to the operating room and  placed in a right lateral decubitus position.  She was monitored and  sedated by the Anesthesia Department.  The patient was identified as the  correct patient and correct procedure.  The left upper back was prepped  and draped in a sterile fashion.   I observed the healed 2.0 cm length incision in the left upper back  laterally.  This area was anesthetized with 1% Xylocaine with  epinephrine.  Using a marking pen, I marked out about a 4.5 cm x 2.5 cm  elliptical  incision.  Using a knife, I took this straight down to the  muscle fascia.  I marked the superior and lateral margins with silk  sutures and sent the soft tissue attached to the skin as a routine  histology.  Hemostasis was excellent and achieved with electrocautery.  The subcutaneous tissue was closed with interrupted sutures of 3-0  Vicryl and the skin was closed with running simple suture of 3-0 nylon.  Clean bandages were placed.  The patient was taken to the recovery room  in stable condition.  Estimated blood loss was about 5 mL or less.  Complications were none.  Sponge, needle and instrument counts were  correct.      Angelia Mould. Derrell Lolling, M.D.  Electronically Signed     HMI/MEDQ  D:  08/06/2007  T:  08/06/2007  Job:  [id number]   cc:   Pierce Crane, M.D.

## 2011-04-03 NOTE — Op Note (Signed)
[name], [name]              ACCOUNT NO.:  [id number]   MEDICAL RECORD NO.:  [id number]          PATIENT TYPE:  AMB   LOCATION:  DSC                          FACILITY:  [facility]   PHYSICIAN:  Angelia Mould. Derrell Lolling, M.D.DATE OF BIRTH:  Mar 19, 1951   DATE OF PROCEDURE:  07/08/2007  DATE OF DISCHARGE:                               OPERATIVE REPORT   PREOPERATIVE DIAGNOSIS:  Recurrent infected cyst, left upper back.   POSTOPERATIVE DIAGNOSIS:  Recurrent infected cyst, left upper back.   OPERATION PERFORMED:  Excision of 8-mm cyst, left upper back.   SURGEON:  Claud Kelp, MD   OPERATIVE INDICATIONS:  This is a 60 year old black female a history of  left mastectomy for breast cancer; this was 2 years ago.  She presented  to our office a few weeks ago with an infected cyst in her left back and  underwent to evaluation by Dr. Violeta Gelinas and was placed on  antibiotics and the acute inflammation subsided.  She did not want to  have a recurrence.  She still has a palpable mass and some  hyperpigmentation in the left upper back consistent with an inclusion  cyst.  She is brought to the operating room electively for that  excision.   OPERATIVE TECHNIQUE:  The patient was brought to a minor procedure room  at [facility].  She was placed a right lateral decubitus  position.  The left upper back was prepped and draped in a sterile  fashion.  Xylocaine 1% with epinephrine was used as a local infiltration  anesthetic.  An elliptical-shaped, oblique incision was made and the  cyst was completely excised and sent to Pathology.  Hemostasis was very  good.  The skin was closed with 3 interrupted sutures of 3-0 nylon, as  the skin was very thick in this area.  Clean bandage was placed and the  patient taken back to recovery area in excellent condition.  Instrument  counts were correct.  No complications.      Angelia Mould. Derrell Lolling, M.D.  Electronically Signed     HMI/MEDQ  D:   07/08/2007  T:  07/09/2007  Job:  [id number]

## 2011-04-06 NOTE — Op Note (Signed)
NAME:  Emily [name], Emily [name]              ACCOUNT NO.:  [id number]   MEDICAL RECORD NO.:  [id number]          PATIENT TYPE:  AMB   LOCATION:  DSC                          FACILITY:  [facility]   PHYSICIAN:  Angelia Mould. Derrell Lolling, M.D.DATE OF BIRTH:  Jan 19, 1951   DATE OF PROCEDURE:  05/28/2006  DATE OF DISCHARGE:                                 OPERATIVE REPORT   PREOPERATIVE DIAGNOSIS:  Breast cancer.   POSTOPERATIVE DIAGNOSIS:  Breast cancer.   OPERATION PERFORMED:  Removal of Port-A-Cath.   SURGEON:  Angelia Mould. Derrell Lolling, M.D.   OPERATIVE INDICATIONS:  This is a 60 year old black female who underwent  left modified radical mastectomy and insertion of Port-A-Cath on August 20, 2005.  She has had adjuvant chemotherapy under the direction of Dr. Pierce Crane.  He has completed her chemotherapy; and has referred her back to me  for removal of the Port-A-Cath.  The patient has been counseled regarding  the indications and details, and risks of this procedure; and is brought to  [facility] minor procedure room electively.   OPERATIVE TECHNIQUE:  The patient was placed supine on the operating table.  The right upper anterior chest was prepped and draped in a sterile fashion.  Then 1% Xylocaine with epinephrine was used as a local infiltration  anesthetic.  A transverse incision was made in the right infraclavicular  area through the previous scar.  Dissection was carried down into the deep  subcutaneous tissue where the port was identified.  The port was mobilized  from the deep subcutaneous tissue.  The two Prolene sutures holding the port  in place were cut; and then we dissected some of the fibrous tissue around  the locking device; and then we were able to remove the port and the  catheter intact.  There was no bleeding.  The subcutaneous tissue was closed  with interrupted sutures of 3-0 Vicryl; and the skin closed with running  subcuticular suture of 4-0 Monocryl and Steri-Strips.  Clean  bandages were  placed; the patient taken to recovery room in stable condition.  Estimated  blood loss was about 5 mL.  Complications none.  Sponge, needle, and  instrument counts were correct.      Angelia Mould. Derrell Lolling, M.D.  Electronically Signed     HMI/MEDQ  D:  05/28/2006  T:  05/28/2006  Job:  [id number]   cc:   Pierce Crane, M.D.  Fax: [phone]

## 2011-04-06 NOTE — Op Note (Signed)
NAME:  Emily [name], Emily [name]              ACCOUNT NO.:  [id number]   MEDICAL RECORD NO.:  [id number]          PATIENT TYPE:  AMB   LOCATION:  DAY                          FACILITY:  [facility]   PHYSICIAN:  Wilmon Arms. Corliss Skains, M.D. DATE OF BIRTH:  October 19, 1951   DATE OF PROCEDURE:  12/28/2005  DATE OF DISCHARGE:                                 OPERATIVE REPORT   PREOPERATIVE DIAGNOSIS:  Chronic calculus cholecystitis.   POSTOPERATIVE DIAGNOSIS:  Chronic calculus cholecystitis.   PROCEDURE PERFORMED:  Laparoscopic cholecystectomy with intraoperative  cholangiogram.   SURGEON:  Dr. Corliss Skains   ASSISTANT:  Dr. Lurene Shadow   ANESTHESIA:  General endotracheal.   INDICATIONS:  The patient is a 60 year old female, who has stage II breast  cancer, who underwent a mastectomy by Dr. Derrell Lolling in 2006.  She is currently  undergoing chemotherapy.  Recently she developed abdominal pain in her right  upper quadrant which was exacerbated by eating.  She underwent a workup  which included an ultrasound showing a contracted gallbladder containing  gallstones.  This had previously been seen on a CT scan.  When the patient  underwent an ultrasound, she had a positive sonographic Murphy's sign.  The  patient was referred for a surgical evaluation, and we decided to proceed  with laparoscopic cholecystectomy.  She was on Coumadin for internal jugular  DVT, but the Coumadin was held, and the patient was on Lovenox.   DESCRIPTION OF PROCEDURE:  The patient was brought to the operating room and  placed in supine position on the operating room table.  After an adequate  level of general anesthesia was obtained, the patient's abdomen was prepped  with Betadine and draped in sterile fashion.  The area around the umbilicus  was infiltrated with 0.25% Marcaine after a time-out was taken to ensure the  proper patient, proper procedure.  A vertical incision was made, and  dissection was carried down the fascia.  Fascia was grasped  with the clamps  and opened vertically.  The peritoneal cavity was bluntly entered.  Finger  dissection. showed some flimsy adhesions of the omentum to the undersurface  of the abdominal wall.  A 0 Vicryl pursestring suture was placed around the  fascial opening, and the Hasson cannula was inserted.  This was secured with  stay sutures, and pneumoperitoneum was obtained by insufflating CO2  maintaining maximum pressure of 150 mmHg.  The laparoscope was inserted, and  the abdominal cavity was quickly examined.  There were some flimsy adhesions  in the midline, but overall the abdominal cavity looked fairly normal.  The  liver appeared normal with no obvious lesions.  The gallbladder was enlarged  and slightly inflamed.  There were adhesions to the surface of the  gallbladder.  A 10 mm port was placed in the subxiphoid position.  Two 5 mm  ports were placed in the right upper quadrant.  The gallbladder was grasped  with clamp and elevated.  Cautery dissection was used to dissect the  adhesions away from the surface of the gallbladder.  The dissection was  carried down to the hilum of the  gallbladder where the cystic duct was  identified.  This was circumferentially dissected and ligated with a clip  distally.  A small opening was created on the cystic duct.  A Cook  cholangiogram catheter was brought through a separate stab incision and  inserted into the cystic duct.  The cholangiogram was then obtained showing  good flow of contrast proximally and distally in the biliary tree with no  evidence of obstruction or filling defect.  There was good flow to the  duodenum.  The catheter was removed, and the cystic duct was ligated with  clips.  Cystic artery was also ligated with clips and divided.  Cautery was  then used to dissect the gallbladder away from the liver bed.  The  gallbladder was placed in EndoCatch sac.  The liver bed was then inspected.  Hemostasis was obtained with cautery.  The  irrigant was suctioned out.  The  EndoCatch sac and gallbladder were removed through the umbilical port.  The  pursestring suture was used to close the umbilical fascia.  An additional 0  Vicryls was used to complete the closure.  We reinspected the right upper  quadrant, and no further bleeding was noted.  The ports were removed as  pneumoperitoneum was released.  Then 4-0 Monocryl was used to close the skin  in subcuticular fashion.  The port sites had previously been infiltrated  with 0.25% Marcaine.  Steri-Strips and clean dressings were applied.  The  patient was then extubated and brought to the recovery room in stable  condition.      Wilmon Arms. Tsuei, M.D.  Electronically Signed     MKT/MEDQ  D:  12/28/2005  T:  12/29/2005  Job:  [id number]   cc:   Pierce Crane, M.D.  Fax: [phone]   Angelia Mould. Derrell Lolling, M.D.  [address], [address]  [place]  [place] [address]

## 2011-04-06 NOTE — Discharge Summary (Signed)
NAME:  Emily [name], Emily [name] NO.:  [id number]   MEDICAL RECORD NO.:  [id number]          PATIENT TYPE:  INP   LOCATION:  1330                         FACILITY:  [facility]   PHYSICIAN:  Pierce Crane, M.D.      DATE OF BIRTH:  March 18, 1951   DATE OF ADMISSION:  10/23/2005  DATE OF DISCHARGE:                                 DISCHARGE SUMMARY   DISCHARGE DIAGNOSIS:  History of stage IIb breast cancer status post second  cycle of TAC chemotherapy.   This 60 year old woman was admitted because of fevers, shaking chills,  feeling poorly, temperature 101 on admission. On initial evaluation the  patient appeared stable, temperature was 98.6, pulse was 90, respiratory  rate was 20, blood pressure was 99/63. She did have blood cultures in the  office and was admitted. Initial labs on December 6:  White count was 5,  hemoglobin 12.1, platelet count of 122. She was also on Coumadin. She was  placed on IV antibiotics. Urine did grow E. coli. She did have blood  cultures done in the office which did grow gram negative rods after 24 hours  and so she was kept in the hospital on IV antibiotics, initially imipenem  and then [place]. She remained stable in the hospital. She was continued on  her Coumadin. She had no other complications in the hospital. She was  started on Magic Mouthwash because of some mouth soreness. On October 28, 2005, white count was 17, hemoglobin 12.6, platelet count of 119,  chemistries within normal limits. As noted, she had no other complications  in hospital.   PROCEDURES IN HOSPITAL:  Chest x-ray.   The patient discharged in stable condition October 29, 2005.   DISCHARGE MEDICATIONS:  1.  Cipro 500 mg p.o. b.i.d. x1 week.  2.  Coumadin 5 mg a day.   She is instructed to return to the office on December 15 for labs.      Pierce Crane, M.D.  Electronically Signed     PR/MEDQ  D:  10/29/2005  T:  10/29/2005  Job:  [id number]

## 2011-04-06 NOTE — Op Note (Signed)
NAMEJOURNI, MOFFA              ACCOUNT NO.:  [id number]   MEDICAL RECORD NO.:  [id number]          PATIENT TYPE:  AMB   LOCATION:  SDS                          FACILITY:  [facility]   PHYSICIAN:  Angelia Mould. 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   POSTOP DIAGNOSIS:  Carcinoma of the left breast with axillary lymph node  metastasis.   OPERATION PERFORMED:  1.  Left modified radical mastectomy.  2.  Insertion of Port-A-Cath.   SURGEON:  Angelia Mould. Derrell Lolling, M.D.   FIRST ASSISTANT:  Sandria Bales. Ezzard Standing, M.D.   OPERATIVE INDICATIONS:  This is a 60 year old black female who presented  recently with a mass in the central portion of the left breast and  significant inversion of the nipple and areolar complex. The mammograms and  ultrasound were performed revealing a 1.7 cm spiculated retroareolar mass.  This was biopsied and revealed breast cancer. There is also a suspicious  left axillary lymph node which was biopsied with ultrasound guidance and  revealed metastatic breast cancer. MRI showed that the right breast looked  fine, but in the left breast there was a 3.1 cm central spiculated mass and  there were two satellite lesions that were very suspicious for cancer, one  medially and one laterally. On exam she has a central mass with inversion of  the nipple and areolar complex. The left axilla feels like there may be some  slightly enlarged lymph nodes but nothing massive. Chest x-ray looks normal.  She is brought to operating room for a left modified radical mastectomy and  a Port-A-Cath insertion to facilitate chemotherapy.   OPERATIVE TECHNIQUE:  Following induction of general endotracheal  anesthesia, the patient's chest wall bilaterally, neck bilaterally and  axilla on the left were  prepped and draped in sterile fashion. A transverse  elliptical incision was made around the breast encompassing the nipple and  areolar complex. Skin flaps were raised superiorly to just under the  clavicle, medially to the parasternal space, inferiorly to the rectus sheath  and laterally to the latissimus dorsi muscle. The breast was dissected off  the pectoralis muscle using electrocautery leaving the pectoralis major  muscle intact. Dissection was carried laterally mobilizing the lateral  border of pectoralis major and the pectoralis minor muscle. We incised the  clavipectoral fascia and entered the axilla. We identified the left axillary  vein. Some venous tributaries were controlled with metal clips and a couple  of them were bleeding and required suture ligature with 3-0 silk. The  axillary vein itself looked fine. We dissected all the axillary contents out  in level I and level II. This was not very massive in nature and at the  completion of the dissection there was no palpable abnormality in the  axilla. We identified the thoracodorsal nerve and the artery and vein and we  identified the long thoracic nerve and these were preserved. At the  completion of the case these nerves were functioning normally. The breast  and axillary specimen was removed and sent to pathology. Hemostasis was  excellent and achieved with electrocautery. The wound was irrigated with  saline. Two 19-French Blake drains were placed, one across the skin flaps  and one up into the axilla and brought out through separate stab incisions  inferolaterally. These were sutured the skin with nylon sutures and  connected to suction bulbs. Skin was closed with skin staples.   We then placed the patient in a Trendelenburg position with the neck turned  to the left. 0.5% Marcaine with epinephrine was used. A right internal  jugular venipuncture was performed and a guidewire inserted into the central  venous circulation  under fluoroscopic guidance. A small transverse incision  was made at the wire insertion site. A transverse incision was made about  2.5 cm below the right clavicle. Subcutaneous pocket was created just above  the pectoralis fascia. The port catheter was drawn between a subcutaneous  tunnel between the neck incision and the infraclavicular incision. Using the  fluoroscopy, we judged the length of the catheter so that it would be in the  superior vena cava near the right atrium. The catheter was cut and secured  to the port with a locking device. The port and catheter were flushed with  heparinized saline. The port was sutured to the pectoralis fascia with two  interrupted sutures of 3-0 Prolene. With the patient back in Trendelenburg  position, we inserted the dilator and peel-away sheath over the wire into  the superior vena cava. The wire and the dilator were removed and the  catheter inserted through the peel-away sheath. Peel-away sheath was  removed. The catheter flushed easily and had excellent blood return. The  catheter was flushed with concentrated heparin solution. Fluoroscopy  revealed that the catheter tip was in the superior vena cava near the right  atrium. There was no kink or cramping of the catheter anywhere along its  course. Subcutaneous tissue was closed with interrupted sutures of 3-0  Vicryl. The skin incision was closed with subcuticular sutures of 4-0  Monocryl. Steri-Strips. Clean bandages were placed. The patient taken  recovery room in stable condition. Estimated blood loss was about 100 mL.  Complications none.  Sponge, needle and instrument counts were correct.      Angelia Mould. Derrell Lolling, M.D.  Electronically Signed     HMI/MEDQ  D:  08/20/2005  T:  08/20/2005  Job:  [id number]

## 2011-07-19 ENCOUNTER — Encounter (HOSPITAL_BASED_OUTPATIENT_CLINIC_OR_DEPARTMENT_OTHER): Payer: Medicare Other | Admitting: Oncology

## 2011-07-19 ENCOUNTER — Other Ambulatory Visit: Payer: Self-pay | Admitting: Oncology

## 2011-07-19 ENCOUNTER — Ambulatory Visit (HOSPITAL_COMMUNITY)
Admission: RE | Admit: 2011-07-19 | Discharge: 2011-07-19 | Disposition: A | Discharge status: Home / Self Care | Payer: Medicare Other | Source: Ambulatory Visit | Attending: Oncology | Admitting: Oncology

## 2011-07-19 ENCOUNTER — Encounter (HOSPITAL_COMMUNITY)
Admission: RE | Admit: 2011-07-19 | Discharge: 2011-07-19 | Disposition: A | Discharge status: Home / Self Care | Payer: Medicare Other | Source: Ambulatory Visit | Attending: Oncology | Admitting: Oncology

## 2011-07-19 DIAGNOSIS — C50919 Malignant neoplasm of unspecified site of unspecified female breast: Secondary | ICD-10-CM

## 2011-07-19 DIAGNOSIS — M810 Age-related osteoporosis without current pathological fracture: Secondary | ICD-10-CM

## 2011-07-19 DIAGNOSIS — Z170 Estrogen receptor positive status [ER+]: Secondary | ICD-10-CM

## 2011-07-19 DIAGNOSIS — Z86718 Personal history of other venous thrombosis and embolism: Secondary | ICD-10-CM

## 2011-07-19 LAB — CBC WITH DIFFERENTIAL/PLATELET
Basophils Absolute: 0.1 10*3/uL (ref 0.0–0.1)
Eosinophils Absolute: 0.4 10*3/uL (ref 0.0–0.5)
HGB: 17 g/dL — ABNORMAL HIGH (ref 11.6–15.9)
MCV: 89.3 fL (ref 79.5–101.0)
MONO#: 0.7 10*3/uL (ref 0.1–0.9)
MONO%: 6.2 % (ref 0.0–14.0)
NEUT#: 7.8 10*3/uL — ABNORMAL HIGH (ref 1.5–6.5)
RDW: 12.8 % (ref 11.2–14.5)
lymph#: 2.4 10*3/uL (ref 0.9–3.3)

## 2011-07-19 MED ORDER — TECHNETIUM TC 99M MEDRONATE IV KIT
23.3000 | PACK | Freq: Once | INTRAVENOUS | Status: AC | PRN
  Administered 2011-07-19: 23.3 via INTRAVENOUS

## 2011-07-20 LAB — COMPREHENSIVE METABOLIC PANEL
AST: 19 U/L (ref 0–37)
Alkaline Phosphatase: 105 U/L (ref 39–117)
BUN: 6 mg/dL (ref 6–23)
Creatinine, Ser: 0.7 mg/dL (ref 0.50–1.10)
Potassium: 4 mEq/L (ref 3.5–5.3)
Total Bilirubin: 0.5 mg/dL (ref 0.3–1.2)

## 2011-07-20 LAB — VITAMIN D 25 HYDROXY (VIT D DEFICIENCY, FRACTURES): Vit D, 25-Hydroxy: 13 ng/mL — ABNORMAL LOW (ref 30–89)

## 2011-08-09 ENCOUNTER — Ambulatory Visit
Admission: RE | Admit: 2011-08-09 | Discharge: 2011-08-09 | Disposition: A | Discharge status: Home / Self Care | Payer: Medicare Other | Source: Ambulatory Visit | Attending: Oncology | Admitting: Oncology

## 2011-08-09 DIAGNOSIS — Z853 Personal history of malignant neoplasm of breast: Secondary | ICD-10-CM

## 2011-08-09 DIAGNOSIS — Z1239 Encounter for other screening for malignant neoplasm of breast: Secondary | ICD-10-CM

## 2011-08-09 DIAGNOSIS — M8580 Other specified disorders of bone density and structure, unspecified site: Secondary | ICD-10-CM

## 2011-08-09 DIAGNOSIS — Z780 Asymptomatic menopausal state: Secondary | ICD-10-CM

## 2011-08-30 LAB — POCT HEMOGLOBIN-HEMACUE: Hemoglobin: 17.1 — ABNORMAL HIGH

## 2011-09-06 ENCOUNTER — Other Ambulatory Visit: Payer: Self-pay | Admitting: Oncology

## 2011-09-06 ENCOUNTER — Encounter (HOSPITAL_BASED_OUTPATIENT_CLINIC_OR_DEPARTMENT_OTHER): Payer: Medicare Other | Admitting: Oncology

## 2011-09-06 DIAGNOSIS — Z170 Estrogen receptor positive status [ER+]: Secondary | ICD-10-CM

## 2011-09-06 DIAGNOSIS — C50919 Malignant neoplasm of unspecified site of unspecified female breast: Secondary | ICD-10-CM

## 2011-09-06 LAB — CBC WITH DIFFERENTIAL/PLATELET
BASO%: 0.2 % (ref 0.0–2.0)
HCT: 51.7 % — ABNORMAL HIGH (ref 34.8–46.6)
MCHC: 33.8 g/dL (ref 31.5–36.0)
MONO#: 0.5 10*3/uL (ref 0.1–0.9)
NEUT%: 70.5 % (ref 38.4–76.8)
RBC: 5.6 10*6/uL — ABNORMAL HIGH (ref 3.70–5.45)
WBC: 9.9 10*3/uL (ref 3.9–10.3)
lymph#: 2.2 10*3/uL (ref 0.9–3.3)

## 2011-09-06 LAB — COMPREHENSIVE METABOLIC PANEL
ALT: 15 U/L (ref 0–35)
Albumin: 3.7 g/dL (ref 3.5–5.2)
CO2: 26 mEq/L (ref 19–32)
Calcium: 9.5 mg/dL (ref 8.4–10.5)
Chloride: 105 mEq/L (ref 96–112)
Potassium: 3.3 mEq/L — ABNORMAL LOW (ref 3.5–5.3)
Sodium: 141 mEq/L (ref 135–145)
Total Protein: 7.5 g/dL (ref 6.0–8.3)

## 2011-09-06 LAB — CANCER ANTIGEN 27.29: CA 27.29: 17 U/mL (ref 0–39)

## 2011-09-13 ENCOUNTER — Encounter: Payer: Medicare Other | Admitting: Oncology

## 2011-09-14 ENCOUNTER — Encounter: Payer: Medicare Other | Admitting: Oncology

## 2011-09-20 ENCOUNTER — Other Ambulatory Visit: Payer: Self-pay | Admitting: Oncology

## 2011-09-20 ENCOUNTER — Encounter (HOSPITAL_BASED_OUTPATIENT_CLINIC_OR_DEPARTMENT_OTHER): Payer: Medicare Other | Admitting: Oncology

## 2011-09-20 DIAGNOSIS — C773 Secondary and unspecified malignant neoplasm of axilla and upper limb lymph nodes: Secondary | ICD-10-CM

## 2011-09-20 DIAGNOSIS — C50119 Malignant neoplasm of central portion of unspecified female breast: Secondary | ICD-10-CM

## 2011-09-20 DIAGNOSIS — C50019 Malignant neoplasm of nipple and areola, unspecified female breast: Secondary | ICD-10-CM

## 2011-09-20 LAB — CBC WITH DIFFERENTIAL/PLATELET
BASO%: 0.7 % (ref 0.0–2.0)
EOS%: 3.2 % (ref 0.0–7.0)
MCH: 30.6 pg (ref 25.1–34.0)
MCHC: 34.5 g/dL (ref 31.5–36.0)
MONO%: 6.6 % (ref 0.0–14.0)
RDW: 12.5 % (ref 11.2–14.5)
lymph#: 2.8 10*3/uL (ref 0.9–3.3)

## 2011-09-26 ENCOUNTER — Other Ambulatory Visit: Payer: Self-pay | Admitting: Oncology

## 2011-09-26 DIAGNOSIS — D751 Secondary polycythemia: Secondary | ICD-10-CM

## 2011-09-27 ENCOUNTER — Other Ambulatory Visit (HOSPITAL_BASED_OUTPATIENT_CLINIC_OR_DEPARTMENT_OTHER): Payer: Medicare Other | Admitting: Lab

## 2011-09-27 ENCOUNTER — Encounter: Payer: Self-pay | Admitting: Oncology

## 2011-09-27 ENCOUNTER — Other Ambulatory Visit: Payer: Self-pay | Admitting: Oncology

## 2011-09-27 DIAGNOSIS — D751 Secondary polycythemia: Secondary | ICD-10-CM

## 2011-09-27 DIAGNOSIS — D649 Anemia, unspecified: Secondary | ICD-10-CM

## 2011-09-27 LAB — CBC WITH DIFFERENTIAL/PLATELET
BASO%: 0.3 % (ref 0.0–2.0)
Eosinophils Absolute: 0.3 10*3/uL (ref 0.0–0.5)
MCHC: 34.3 g/dL (ref 31.5–36.0)
MCV: 88.9 fL (ref 79.5–101.0)
MONO%: 4.8 % (ref 0.0–14.0)
NEUT#: 8.5 10*3/uL — ABNORMAL HIGH (ref 1.5–6.5)
RBC: 4.79 10*6/uL (ref 3.70–5.45)
RDW: 12.8 % (ref 11.2–14.5)
WBC: 11.8 10*3/uL — ABNORMAL HIGH (ref 3.9–10.3)
nRBC: 0 % (ref 0–0)

## 2011-09-27 NOTE — Progress Notes (Signed)
Pt's HCT today is 42.6.  Per Dr Renelda Loma last MD noted parameters are to get hematocrit to 45 or less.  Phlebotomy held today due to parameters.  Pt made aware and verbalized understanding.  Pt aware of next appointment in one week.

## 2011-10-03 ENCOUNTER — Other Ambulatory Visit: Payer: Self-pay | Admitting: Oncology

## 2011-10-03 DIAGNOSIS — D751 Secondary polycythemia: Secondary | ICD-10-CM

## 2011-10-04 ENCOUNTER — Other Ambulatory Visit: Payer: Self-pay | Admitting: Oncology

## 2011-10-04 ENCOUNTER — Other Ambulatory Visit: Payer: Self-pay

## 2011-10-04 ENCOUNTER — Other Ambulatory Visit (HOSPITAL_BASED_OUTPATIENT_CLINIC_OR_DEPARTMENT_OTHER): Payer: Medicare Other | Admitting: Lab

## 2011-10-04 DIAGNOSIS — C50919 Malignant neoplasm of unspecified site of unspecified female breast: Secondary | ICD-10-CM

## 2011-10-04 DIAGNOSIS — Z170 Estrogen receptor positive status [ER+]: Secondary | ICD-10-CM

## 2011-10-04 DIAGNOSIS — C50119 Malignant neoplasm of central portion of unspecified female breast: Secondary | ICD-10-CM

## 2011-10-04 LAB — CBC WITH DIFFERENTIAL/PLATELET
BASO%: 0.4 % (ref 0.0–2.0)
EOS%: 2.4 % (ref 0.0–7.0)
HCT: 44.5 % (ref 34.8–46.6)
MCH: 31.2 pg (ref 25.1–34.0)
MCHC: 33.7 g/dL (ref 31.5–36.0)
MONO#: 0.5 10*3/uL (ref 0.1–0.9)
NEUT%: 76.6 % (ref 38.4–76.8)
RBC: 4.8 10*6/uL (ref 3.70–5.45)
RDW: 13.6 % (ref 11.2–14.5)
WBC: 10.3 10*3/uL (ref 3.9–10.3)
lymph#: 1.6 10*3/uL (ref 0.9–3.3)

## 2011-10-04 NOTE — Progress Notes (Signed)
HCT = 44.5 today, phlebotomy held; orders to hold phlebotomy if HCT <45; patient aware.

## 2011-10-05 LAB — ERYTHROPOIETIN: Erythropoietin: 14.3 m[IU]/mL (ref 2.6–34.0)

## 2011-10-05 LAB — LEUKOCYTE ALKALINE PHOSPHATASE: Leukocyte Alkaline Phos Stain: 126 (ref 30–140)

## 2011-10-09 ENCOUNTER — Other Ambulatory Visit: Payer: Self-pay | Admitting: Oncology

## 2011-10-09 LAB — JAK-2 V617F

## 2011-10-10 ENCOUNTER — Ambulatory Visit (HOSPITAL_BASED_OUTPATIENT_CLINIC_OR_DEPARTMENT_OTHER): Payer: Medicare Other | Admitting: Oncology

## 2011-10-10 ENCOUNTER — Telehealth: Payer: Self-pay | Admitting: Oncology

## 2011-10-10 VITALS — BP 135/85 | HR 101 | Temp 97.8°F | Ht 66.0 in | Wt 191.1 lb

## 2011-10-10 DIAGNOSIS — D649 Anemia, unspecified: Secondary | ICD-10-CM

## 2011-10-10 DIAGNOSIS — D751 Secondary polycythemia: Secondary | ICD-10-CM

## 2011-10-10 DIAGNOSIS — Z853 Personal history of malignant neoplasm of breast: Secondary | ICD-10-CM

## 2011-10-10 NOTE — Progress Notes (Signed)
Emily [name] returns for followup. She is currently being followed for polycythemia. She has been receiving phlebotomies on a weekly basis. She has received total of 3. Her hematocrit is 44 today. We did do a jak-2 analysis which was negative . 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 Currently we would repeat [name] on a monthly basis assuming hematocrits over 45. Be stable for breast cancer point of view. Most recent bone density and mammogram performed in September were both normal. I plan to see the patient back in 3 months time. Interim she'll receive monthly phlebotomies. She is appears to be doing well.

## 2011-10-10 NOTE — Telephone Encounter (Signed)
Gv pt appt for [id number] °

## 2011-12-25 ENCOUNTER — Other Ambulatory Visit: Payer: Medicare Other | Admitting: Lab

## 2012-01-01 ENCOUNTER — Other Ambulatory Visit: Payer: Self-pay | Admitting: *Deleted

## 2012-01-01 ENCOUNTER — Ambulatory Visit: Payer: Medicare Other | Admitting: Oncology

## 2012-01-02 ENCOUNTER — Telehealth: Payer: Self-pay | Admitting: *Deleted

## 2012-01-02 NOTE — Telephone Encounter (Signed)
left message to inform the patient of the new date and time on 02-22-2012 starting at 02-22-2012

## 2012-02-22 ENCOUNTER — Other Ambulatory Visit: Payer: Medicare Other | Admitting: Lab

## 2012-02-22 ENCOUNTER — Ambulatory Visit: Payer: Medicare Other | Admitting: Oncology

## 2013-01-07 ENCOUNTER — Telehealth: Payer: Self-pay | Admitting: Oncology

## 2013-01-07 NOTE — Telephone Encounter (Signed)
Late entry. On 12/24/12 spoke with her on the phone. She is doing good. No problems.  She states she sees a primary care MD. She is not on an AI. Does not have any major concerns.

## 2020-01-05 DIAGNOSIS — E278 Other specified disorders of adrenal gland: Secondary | ICD-10-CM | POA: Diagnosis not present

## 2020-01-05 DIAGNOSIS — N281 Cyst of kidney, acquired: Secondary | ICD-10-CM | POA: Diagnosis not present

## 2020-01-05 DIAGNOSIS — D3501 Benign neoplasm of right adrenal gland: Secondary | ICD-10-CM | POA: Diagnosis not present

## 2020-01-05 DIAGNOSIS — C19 Malignant neoplasm of rectosigmoid junction: Secondary | ICD-10-CM | POA: Diagnosis not present

## 2020-01-05 DIAGNOSIS — D3502 Benign neoplasm of left adrenal gland: Secondary | ICD-10-CM | POA: Diagnosis not present

## 2020-01-05 DIAGNOSIS — C182 Malignant neoplasm of ascending colon: Secondary | ICD-10-CM | POA: Diagnosis not present

## 2020-01-06 DIAGNOSIS — Z79899 Other long term (current) drug therapy: Secondary | ICD-10-CM | POA: Diagnosis not present

## 2020-01-06 DIAGNOSIS — Z853 Personal history of malignant neoplasm of breast: Secondary | ICD-10-CM | POA: Diagnosis not present

## 2020-01-06 DIAGNOSIS — Z01812 Encounter for preprocedural laboratory examination: Secondary | ICD-10-CM | POA: Diagnosis not present

## 2020-01-06 DIAGNOSIS — E1165 Type 2 diabetes mellitus with hyperglycemia: Secondary | ICD-10-CM | POA: Diagnosis not present

## 2020-01-06 DIAGNOSIS — Z7984 Long term (current) use of oral hypoglycemic drugs: Secondary | ICD-10-CM | POA: Diagnosis not present

## 2020-01-06 DIAGNOSIS — E119 Type 2 diabetes mellitus without complications: Secondary | ICD-10-CM | POA: Diagnosis not present

## 2020-01-06 DIAGNOSIS — Z01818 Encounter for other preprocedural examination: Secondary | ICD-10-CM | POA: Diagnosis not present

## 2020-01-06 DIAGNOSIS — D3501 Benign neoplasm of right adrenal gland: Secondary | ICD-10-CM | POA: Diagnosis not present

## 2020-01-06 DIAGNOSIS — C182 Malignant neoplasm of ascending colon: Secondary | ICD-10-CM | POA: Diagnosis not present

## 2020-01-06 DIAGNOSIS — Z01810 Encounter for preprocedural cardiovascular examination: Secondary | ICD-10-CM | POA: Diagnosis not present

## 2020-01-06 DIAGNOSIS — Z7982 Long term (current) use of aspirin: Secondary | ICD-10-CM | POA: Diagnosis not present

## 2020-01-13 DIAGNOSIS — C182 Malignant neoplasm of ascending colon: Secondary | ICD-10-CM | POA: Diagnosis not present

## 2020-01-13 DIAGNOSIS — E1165 Type 2 diabetes mellitus with hyperglycemia: Secondary | ICD-10-CM | POA: Diagnosis not present

## 2020-01-18 DIAGNOSIS — C182 Malignant neoplasm of ascending colon: Secondary | ICD-10-CM | POA: Diagnosis not present

## 2020-01-18 DIAGNOSIS — Z7984 Long term (current) use of oral hypoglycemic drugs: Secondary | ICD-10-CM | POA: Diagnosis not present

## 2020-01-18 DIAGNOSIS — C772 Secondary and unspecified malignant neoplasm of intra-abdominal lymph nodes: Secondary | ICD-10-CM | POA: Diagnosis not present

## 2020-01-18 DIAGNOSIS — Z9049 Acquired absence of other specified parts of digestive tract: Secondary | ICD-10-CM | POA: Diagnosis not present

## 2020-01-18 DIAGNOSIS — Z01818 Encounter for other preprocedural examination: Secondary | ICD-10-CM | POA: Diagnosis not present

## 2020-01-18 DIAGNOSIS — G8918 Other acute postprocedural pain: Secondary | ICD-10-CM | POA: Diagnosis not present

## 2020-01-18 DIAGNOSIS — Z20822 Contact with and (suspected) exposure to covid-19: Secondary | ICD-10-CM | POA: Diagnosis not present

## 2020-01-18 DIAGNOSIS — E1165 Type 2 diabetes mellitus with hyperglycemia: Secondary | ICD-10-CM | POA: Diagnosis not present

## 2020-01-18 DIAGNOSIS — Z888 Allergy status to other drugs, medicaments and biological substances status: Secondary | ICD-10-CM | POA: Diagnosis not present

## 2020-01-18 DIAGNOSIS — Z853 Personal history of malignant neoplasm of breast: Secondary | ICD-10-CM | POA: Diagnosis not present

## 2020-01-18 DIAGNOSIS — Z79899 Other long term (current) drug therapy: Secondary | ICD-10-CM | POA: Diagnosis not present

## 2020-01-18 DIAGNOSIS — Z7982 Long term (current) use of aspirin: Secondary | ICD-10-CM | POA: Diagnosis not present

## 2020-01-29 DIAGNOSIS — C182 Malignant neoplasm of ascending colon: Secondary | ICD-10-CM | POA: Diagnosis not present

## 2020-01-29 DIAGNOSIS — Z809 Family history of malignant neoplasm, unspecified: Secondary | ICD-10-CM | POA: Diagnosis not present

## 2020-01-29 DIAGNOSIS — Z800 Family history of malignant neoplasm of digestive organs: Secondary | ICD-10-CM | POA: Diagnosis not present

## 2020-01-29 DIAGNOSIS — Z7984 Long term (current) use of oral hypoglycemic drugs: Secondary | ICD-10-CM | POA: Diagnosis not present

## 2020-01-29 DIAGNOSIS — Z801 Family history of malignant neoplasm of trachea, bronchus and lung: Secondary | ICD-10-CM | POA: Diagnosis not present

## 2020-01-29 DIAGNOSIS — Z808 Family history of malignant neoplasm of other organs or systems: Secondary | ICD-10-CM | POA: Diagnosis not present

## 2020-01-29 DIAGNOSIS — Z79899 Other long term (current) drug therapy: Secondary | ICD-10-CM | POA: Diagnosis not present

## 2020-01-29 DIAGNOSIS — Z7982 Long term (current) use of aspirin: Secondary | ICD-10-CM | POA: Diagnosis not present

## 2020-01-29 DIAGNOSIS — E119 Type 2 diabetes mellitus without complications: Secondary | ICD-10-CM | POA: Diagnosis not present

## 2020-01-29 DIAGNOSIS — Z9012 Acquired absence of left breast and nipple: Secondary | ICD-10-CM | POA: Diagnosis not present

## 2020-01-29 DIAGNOSIS — Z9049 Acquired absence of other specified parts of digestive tract: Secondary | ICD-10-CM | POA: Diagnosis not present

## 2020-02-12 DIAGNOSIS — C182 Malignant neoplasm of ascending colon: Secondary | ICD-10-CM | POA: Diagnosis not present

## 2020-02-12 DIAGNOSIS — Z79899 Other long term (current) drug therapy: Secondary | ICD-10-CM | POA: Diagnosis not present

## 2020-02-12 DIAGNOSIS — C189 Malignant neoplasm of colon, unspecified: Secondary | ICD-10-CM | POA: Diagnosis not present

## 2020-02-12 DIAGNOSIS — Z7982 Long term (current) use of aspirin: Secondary | ICD-10-CM | POA: Diagnosis not present

## 2020-02-12 DIAGNOSIS — Z452 Encounter for adjustment and management of vascular access device: Secondary | ICD-10-CM | POA: Diagnosis not present

## 2020-02-16 DIAGNOSIS — Z9012 Acquired absence of left breast and nipple: Secondary | ICD-10-CM | POA: Diagnosis not present

## 2020-02-16 DIAGNOSIS — K828 Other specified diseases of gallbladder: Secondary | ICD-10-CM | POA: Diagnosis not present

## 2020-02-16 DIAGNOSIS — E278 Other specified disorders of adrenal gland: Secondary | ICD-10-CM | POA: Diagnosis not present

## 2020-02-16 DIAGNOSIS — C182 Malignant neoplasm of ascending colon: Secondary | ICD-10-CM | POA: Diagnosis not present

## 2020-02-16 DIAGNOSIS — D3502 Benign neoplasm of left adrenal gland: Secondary | ICD-10-CM | POA: Diagnosis not present

## 2020-02-16 DIAGNOSIS — N200 Calculus of kidney: Secondary | ICD-10-CM | POA: Diagnosis not present

## 2020-02-24 DIAGNOSIS — C182 Malignant neoplasm of ascending colon: Secondary | ICD-10-CM | POA: Diagnosis not present

## 2020-02-24 DIAGNOSIS — Z5111 Encounter for antineoplastic chemotherapy: Secondary | ICD-10-CM | POA: Diagnosis not present

## 2020-02-24 DIAGNOSIS — T7840XS Allergy, unspecified, sequela: Secondary | ICD-10-CM | POA: Diagnosis not present

## 2020-02-24 DIAGNOSIS — Z853 Personal history of malignant neoplasm of breast: Secondary | ICD-10-CM | POA: Diagnosis not present

## 2020-02-24 DIAGNOSIS — Z95828 Presence of other vascular implants and grafts: Secondary | ICD-10-CM | POA: Diagnosis not present

## 2020-02-26 DIAGNOSIS — Z5111 Encounter for antineoplastic chemotherapy: Secondary | ICD-10-CM | POA: Diagnosis not present

## 2020-02-26 DIAGNOSIS — C182 Malignant neoplasm of ascending colon: Secondary | ICD-10-CM | POA: Diagnosis not present

## 2020-02-26 DIAGNOSIS — Z853 Personal history of malignant neoplasm of breast: Secondary | ICD-10-CM | POA: Diagnosis not present

## 2020-03-09 DIAGNOSIS — Z853 Personal history of malignant neoplasm of breast: Secondary | ICD-10-CM | POA: Diagnosis not present

## 2020-03-09 DIAGNOSIS — Z95828 Presence of other vascular implants and grafts: Secondary | ICD-10-CM | POA: Diagnosis not present

## 2020-03-09 DIAGNOSIS — Z5111 Encounter for antineoplastic chemotherapy: Secondary | ICD-10-CM | POA: Diagnosis not present

## 2020-03-09 DIAGNOSIS — C182 Malignant neoplasm of ascending colon: Secondary | ICD-10-CM | POA: Diagnosis not present

## 2020-03-09 DIAGNOSIS — T7840XS Allergy, unspecified, sequela: Secondary | ICD-10-CM | POA: Diagnosis not present

## 2020-03-11 DIAGNOSIS — Z452 Encounter for adjustment and management of vascular access device: Secondary | ICD-10-CM | POA: Diagnosis not present

## 2020-03-11 DIAGNOSIS — C182 Malignant neoplasm of ascending colon: Secondary | ICD-10-CM | POA: Diagnosis not present

## 2020-03-16 DIAGNOSIS — C182 Malignant neoplasm of ascending colon: Secondary | ICD-10-CM | POA: Diagnosis not present

## 2020-03-16 DIAGNOSIS — E1165 Type 2 diabetes mellitus with hyperglycemia: Secondary | ICD-10-CM | POA: Diagnosis not present

## 2020-03-23 DIAGNOSIS — Z95828 Presence of other vascular implants and grafts: Secondary | ICD-10-CM | POA: Diagnosis not present

## 2020-03-23 DIAGNOSIS — L03313 Cellulitis of chest wall: Secondary | ICD-10-CM | POA: Diagnosis not present

## 2020-03-23 DIAGNOSIS — E876 Hypokalemia: Secondary | ICD-10-CM | POA: Diagnosis not present

## 2020-03-23 DIAGNOSIS — C182 Malignant neoplasm of ascending colon: Secondary | ICD-10-CM | POA: Diagnosis not present

## 2020-03-23 DIAGNOSIS — T8090XA Unspecified complication following infusion and therapeutic injection, initial encounter: Secondary | ICD-10-CM | POA: Diagnosis not present

## 2020-03-23 DIAGNOSIS — Z20822 Contact with and (suspected) exposure to covid-19: Secondary | ICD-10-CM | POA: Diagnosis not present

## 2020-03-23 DIAGNOSIS — T7849XA Other allergy, initial encounter: Secondary | ICD-10-CM | POA: Diagnosis not present

## 2020-03-23 DIAGNOSIS — T451X5A Adverse effect of antineoplastic and immunosuppressive drugs, initial encounter: Secondary | ICD-10-CM | POA: Diagnosis not present

## 2020-03-23 DIAGNOSIS — Z7982 Long term (current) use of aspirin: Secondary | ICD-10-CM | POA: Diagnosis not present

## 2020-03-23 DIAGNOSIS — Z5111 Encounter for antineoplastic chemotherapy: Secondary | ICD-10-CM | POA: Diagnosis not present

## 2020-03-23 DIAGNOSIS — Z853 Personal history of malignant neoplasm of breast: Secondary | ICD-10-CM | POA: Diagnosis not present

## 2020-03-23 DIAGNOSIS — L98499 Non-pressure chronic ulcer of skin of other sites with unspecified severity: Secondary | ICD-10-CM | POA: Diagnosis not present

## 2020-03-23 DIAGNOSIS — Z79899 Other long term (current) drug therapy: Secondary | ICD-10-CM | POA: Diagnosis not present

## 2020-03-23 DIAGNOSIS — L818 Other specified disorders of pigmentation: Secondary | ICD-10-CM | POA: Diagnosis not present

## 2020-03-23 DIAGNOSIS — R112 Nausea with vomiting, unspecified: Secondary | ICD-10-CM | POA: Diagnosis not present

## 2020-03-24 DIAGNOSIS — Z803 Family history of malignant neoplasm of breast: Secondary | ICD-10-CM | POA: Diagnosis not present

## 2020-03-24 DIAGNOSIS — Z808 Family history of malignant neoplasm of other organs or systems: Secondary | ICD-10-CM | POA: Diagnosis not present

## 2020-03-24 DIAGNOSIS — Z800 Family history of malignant neoplasm of digestive organs: Secondary | ICD-10-CM | POA: Diagnosis not present

## 2020-03-24 DIAGNOSIS — Z853 Personal history of malignant neoplasm of breast: Secondary | ICD-10-CM | POA: Diagnosis not present

## 2020-03-24 DIAGNOSIS — C182 Malignant neoplasm of ascending colon: Secondary | ICD-10-CM | POA: Diagnosis not present

## 2020-03-24 DIAGNOSIS — Z8521 Personal history of malignant neoplasm of larynx: Secondary | ICD-10-CM | POA: Diagnosis not present

## 2020-03-25 DIAGNOSIS — Z853 Personal history of malignant neoplasm of breast: Secondary | ICD-10-CM | POA: Diagnosis not present

## 2020-03-25 DIAGNOSIS — Z20822 Contact with and (suspected) exposure to covid-19: Secondary | ICD-10-CM | POA: Diagnosis not present

## 2020-03-25 DIAGNOSIS — C182 Malignant neoplasm of ascending colon: Secondary | ICD-10-CM | POA: Diagnosis not present

## 2020-03-25 DIAGNOSIS — T8090XA Unspecified complication following infusion and therapeutic injection, initial encounter: Secondary | ICD-10-CM | POA: Diagnosis not present

## 2020-03-25 DIAGNOSIS — T7849XA Other allergy, initial encounter: Secondary | ICD-10-CM | POA: Diagnosis not present

## 2020-03-25 DIAGNOSIS — Z5111 Encounter for antineoplastic chemotherapy: Secondary | ICD-10-CM | POA: Diagnosis not present

## 2020-03-25 DIAGNOSIS — L03313 Cellulitis of chest wall: Secondary | ICD-10-CM | POA: Diagnosis not present

## 2020-03-25 DIAGNOSIS — L818 Other specified disorders of pigmentation: Secondary | ICD-10-CM | POA: Diagnosis not present

## 2020-03-25 DIAGNOSIS — Z79899 Other long term (current) drug therapy: Secondary | ICD-10-CM | POA: Diagnosis not present

## 2020-03-25 DIAGNOSIS — Z7982 Long term (current) use of aspirin: Secondary | ICD-10-CM | POA: Diagnosis not present

## 2020-03-25 DIAGNOSIS — E876 Hypokalemia: Secondary | ICD-10-CM | POA: Diagnosis not present

## 2020-03-25 DIAGNOSIS — R112 Nausea with vomiting, unspecified: Secondary | ICD-10-CM | POA: Diagnosis not present

## 2020-03-25 DIAGNOSIS — L98499 Non-pressure chronic ulcer of skin of other sites with unspecified severity: Secondary | ICD-10-CM | POA: Diagnosis not present

## 2020-03-25 DIAGNOSIS — T451X5A Adverse effect of antineoplastic and immunosuppressive drugs, initial encounter: Secondary | ICD-10-CM | POA: Diagnosis not present

## 2020-04-06 DIAGNOSIS — T8090XA Unspecified complication following infusion and therapeutic injection, initial encounter: Secondary | ICD-10-CM | POA: Diagnosis not present

## 2020-04-06 DIAGNOSIS — L03313 Cellulitis of chest wall: Secondary | ICD-10-CM | POA: Diagnosis not present

## 2020-04-06 DIAGNOSIS — T7849XA Other allergy, initial encounter: Secondary | ICD-10-CM | POA: Diagnosis not present

## 2020-04-06 DIAGNOSIS — Z20822 Contact with and (suspected) exposure to covid-19: Secondary | ICD-10-CM | POA: Diagnosis not present

## 2020-04-06 DIAGNOSIS — Z7982 Long term (current) use of aspirin: Secondary | ICD-10-CM | POA: Diagnosis not present

## 2020-04-06 DIAGNOSIS — Z5111 Encounter for antineoplastic chemotherapy: Secondary | ICD-10-CM | POA: Diagnosis not present

## 2020-04-06 DIAGNOSIS — E876 Hypokalemia: Secondary | ICD-10-CM | POA: Diagnosis not present

## 2020-04-06 DIAGNOSIS — L818 Other specified disorders of pigmentation: Secondary | ICD-10-CM | POA: Diagnosis not present

## 2020-04-06 DIAGNOSIS — Z95828 Presence of other vascular implants and grafts: Secondary | ICD-10-CM | POA: Diagnosis not present

## 2020-04-06 DIAGNOSIS — T451X5A Adverse effect of antineoplastic and immunosuppressive drugs, initial encounter: Secondary | ICD-10-CM | POA: Diagnosis not present

## 2020-04-06 DIAGNOSIS — C182 Malignant neoplasm of ascending colon: Secondary | ICD-10-CM | POA: Diagnosis not present

## 2020-04-06 DIAGNOSIS — L98499 Non-pressure chronic ulcer of skin of other sites with unspecified severity: Secondary | ICD-10-CM | POA: Diagnosis not present

## 2020-04-06 DIAGNOSIS — Z853 Personal history of malignant neoplasm of breast: Secondary | ICD-10-CM | POA: Diagnosis not present

## 2020-04-06 DIAGNOSIS — R112 Nausea with vomiting, unspecified: Secondary | ICD-10-CM | POA: Diagnosis not present

## 2020-04-06 DIAGNOSIS — Z79899 Other long term (current) drug therapy: Secondary | ICD-10-CM | POA: Diagnosis not present

## 2020-04-06 DIAGNOSIS — C50912 Malignant neoplasm of unspecified site of left female breast: Secondary | ICD-10-CM | POA: Diagnosis not present

## 2020-04-20 DIAGNOSIS — L03313 Cellulitis of chest wall: Secondary | ICD-10-CM | POA: Diagnosis not present

## 2020-04-20 DIAGNOSIS — Z5111 Encounter for antineoplastic chemotherapy: Secondary | ICD-10-CM | POA: Diagnosis not present

## 2020-04-20 DIAGNOSIS — E876 Hypokalemia: Secondary | ICD-10-CM | POA: Diagnosis not present

## 2020-04-20 DIAGNOSIS — Z95828 Presence of other vascular implants and grafts: Secondary | ICD-10-CM | POA: Diagnosis not present

## 2020-04-20 DIAGNOSIS — T7849XA Other allergy, initial encounter: Secondary | ICD-10-CM | POA: Diagnosis not present

## 2020-04-20 DIAGNOSIS — Z853 Personal history of malignant neoplasm of breast: Secondary | ICD-10-CM | POA: Diagnosis not present

## 2020-04-20 DIAGNOSIS — Z79899 Other long term (current) drug therapy: Secondary | ICD-10-CM | POA: Diagnosis not present

## 2020-04-20 DIAGNOSIS — L0889 Other specified local infections of the skin and subcutaneous tissue: Secondary | ICD-10-CM | POA: Diagnosis not present

## 2020-04-20 DIAGNOSIS — T8089XA Other complications following infusion, transfusion and therapeutic injection, initial encounter: Secondary | ICD-10-CM | POA: Diagnosis not present

## 2020-04-20 DIAGNOSIS — Z809 Family history of malignant neoplasm, unspecified: Secondary | ICD-10-CM | POA: Diagnosis not present

## 2020-04-20 DIAGNOSIS — C182 Malignant neoplasm of ascending colon: Secondary | ICD-10-CM | POA: Diagnosis not present

## 2020-04-22 DIAGNOSIS — T7849XA Other allergy, initial encounter: Secondary | ICD-10-CM | POA: Diagnosis not present

## 2020-04-22 DIAGNOSIS — L0889 Other specified local infections of the skin and subcutaneous tissue: Secondary | ICD-10-CM | POA: Diagnosis not present

## 2020-04-22 DIAGNOSIS — C182 Malignant neoplasm of ascending colon: Secondary | ICD-10-CM | POA: Diagnosis not present

## 2020-04-22 DIAGNOSIS — Z809 Family history of malignant neoplasm, unspecified: Secondary | ICD-10-CM | POA: Diagnosis not present

## 2020-04-22 DIAGNOSIS — E876 Hypokalemia: Secondary | ICD-10-CM | POA: Diagnosis not present

## 2020-04-22 DIAGNOSIS — Z5111 Encounter for antineoplastic chemotherapy: Secondary | ICD-10-CM | POA: Diagnosis not present

## 2020-04-22 DIAGNOSIS — T8089XA Other complications following infusion, transfusion and therapeutic injection, initial encounter: Secondary | ICD-10-CM | POA: Diagnosis not present

## 2020-04-22 DIAGNOSIS — Z79899 Other long term (current) drug therapy: Secondary | ICD-10-CM | POA: Diagnosis not present

## 2020-04-22 DIAGNOSIS — Z95828 Presence of other vascular implants and grafts: Secondary | ICD-10-CM | POA: Diagnosis not present

## 2020-04-22 DIAGNOSIS — Z853 Personal history of malignant neoplasm of breast: Secondary | ICD-10-CM | POA: Diagnosis not present

## 2020-05-04 DIAGNOSIS — Z79899 Other long term (current) drug therapy: Secondary | ICD-10-CM | POA: Diagnosis not present

## 2020-05-04 DIAGNOSIS — T8089XA Other complications following infusion, transfusion and therapeutic injection, initial encounter: Secondary | ICD-10-CM | POA: Diagnosis not present

## 2020-05-04 DIAGNOSIS — E876 Hypokalemia: Secondary | ICD-10-CM | POA: Diagnosis not present

## 2020-05-04 DIAGNOSIS — T7849XA Other allergy, initial encounter: Secondary | ICD-10-CM | POA: Diagnosis not present

## 2020-05-04 DIAGNOSIS — C182 Malignant neoplasm of ascending colon: Secondary | ICD-10-CM | POA: Diagnosis not present

## 2020-05-04 DIAGNOSIS — L0889 Other specified local infections of the skin and subcutaneous tissue: Secondary | ICD-10-CM | POA: Diagnosis not present

## 2020-05-04 DIAGNOSIS — Z5111 Encounter for antineoplastic chemotherapy: Secondary | ICD-10-CM | POA: Diagnosis not present

## 2020-05-04 DIAGNOSIS — Z853 Personal history of malignant neoplasm of breast: Secondary | ICD-10-CM | POA: Diagnosis not present

## 2020-05-04 DIAGNOSIS — Z95828 Presence of other vascular implants and grafts: Secondary | ICD-10-CM | POA: Diagnosis not present

## 2020-05-04 DIAGNOSIS — Z809 Family history of malignant neoplasm, unspecified: Secondary | ICD-10-CM | POA: Diagnosis not present

## 2020-05-06 DIAGNOSIS — Z79899 Other long term (current) drug therapy: Secondary | ICD-10-CM | POA: Diagnosis not present

## 2020-05-06 DIAGNOSIS — T7849XA Other allergy, initial encounter: Secondary | ICD-10-CM | POA: Diagnosis not present

## 2020-05-06 DIAGNOSIS — E876 Hypokalemia: Secondary | ICD-10-CM | POA: Diagnosis not present

## 2020-05-06 DIAGNOSIS — Z95828 Presence of other vascular implants and grafts: Secondary | ICD-10-CM | POA: Diagnosis not present

## 2020-05-06 DIAGNOSIS — C182 Malignant neoplasm of ascending colon: Secondary | ICD-10-CM | POA: Diagnosis not present

## 2020-05-06 DIAGNOSIS — Z853 Personal history of malignant neoplasm of breast: Secondary | ICD-10-CM | POA: Diagnosis not present

## 2020-05-06 DIAGNOSIS — T8089XA Other complications following infusion, transfusion and therapeutic injection, initial encounter: Secondary | ICD-10-CM | POA: Diagnosis not present

## 2020-05-06 DIAGNOSIS — Z809 Family history of malignant neoplasm, unspecified: Secondary | ICD-10-CM | POA: Diagnosis not present

## 2020-05-06 DIAGNOSIS — Z5111 Encounter for antineoplastic chemotherapy: Secondary | ICD-10-CM | POA: Diagnosis not present

## 2020-05-06 DIAGNOSIS — L0889 Other specified local infections of the skin and subcutaneous tissue: Secondary | ICD-10-CM | POA: Diagnosis not present

## 2020-05-16 DIAGNOSIS — C182 Malignant neoplasm of ascending colon: Secondary | ICD-10-CM | POA: Diagnosis not present

## 2020-05-16 DIAGNOSIS — E1165 Type 2 diabetes mellitus with hyperglycemia: Secondary | ICD-10-CM | POA: Diagnosis not present

## 2020-05-16 DIAGNOSIS — I10 Essential (primary) hypertension: Secondary | ICD-10-CM | POA: Diagnosis not present

## 2020-05-18 DIAGNOSIS — D6959 Other secondary thrombocytopenia: Secondary | ICD-10-CM | POA: Diagnosis not present

## 2020-05-18 DIAGNOSIS — E876 Hypokalemia: Secondary | ICD-10-CM | POA: Diagnosis not present

## 2020-05-18 DIAGNOSIS — Z86718 Personal history of other venous thrombosis and embolism: Secondary | ICD-10-CM | POA: Diagnosis not present

## 2020-05-18 DIAGNOSIS — C182 Malignant neoplasm of ascending colon: Secondary | ICD-10-CM | POA: Diagnosis not present

## 2020-05-18 DIAGNOSIS — Z5111 Encounter for antineoplastic chemotherapy: Secondary | ICD-10-CM | POA: Diagnosis not present

## 2020-05-18 DIAGNOSIS — Z853 Personal history of malignant neoplasm of breast: Secondary | ICD-10-CM | POA: Diagnosis not present

## 2020-05-18 DIAGNOSIS — Z95828 Presence of other vascular implants and grafts: Secondary | ICD-10-CM | POA: Diagnosis not present

## 2020-05-18 DIAGNOSIS — Z9012 Acquired absence of left breast and nipple: Secondary | ICD-10-CM | POA: Diagnosis not present

## 2020-05-18 DIAGNOSIS — Z7984 Long term (current) use of oral hypoglycemic drugs: Secondary | ICD-10-CM | POA: Diagnosis not present

## 2020-05-18 DIAGNOSIS — Z7982 Long term (current) use of aspirin: Secondary | ICD-10-CM | POA: Diagnosis not present

## 2020-05-18 DIAGNOSIS — Z91048 Other nonmedicinal substance allergy status: Secondary | ICD-10-CM | POA: Diagnosis not present

## 2020-05-18 DIAGNOSIS — Z9049 Acquired absence of other specified parts of digestive tract: Secondary | ICD-10-CM | POA: Diagnosis not present

## 2020-05-18 DIAGNOSIS — T451X5A Adverse effect of antineoplastic and immunosuppressive drugs, initial encounter: Secondary | ICD-10-CM | POA: Diagnosis not present

## 2020-05-18 DIAGNOSIS — E119 Type 2 diabetes mellitus without complications: Secondary | ICD-10-CM | POA: Diagnosis not present

## 2020-05-20 DIAGNOSIS — Z95828 Presence of other vascular implants and grafts: Secondary | ICD-10-CM | POA: Diagnosis not present

## 2020-05-20 DIAGNOSIS — C182 Malignant neoplasm of ascending colon: Secondary | ICD-10-CM | POA: Diagnosis not present

## 2020-05-20 DIAGNOSIS — Z452 Encounter for adjustment and management of vascular access device: Secondary | ICD-10-CM | POA: Diagnosis not present

## 2020-06-01 DIAGNOSIS — Z5111 Encounter for antineoplastic chemotherapy: Secondary | ICD-10-CM | POA: Diagnosis not present

## 2020-06-01 DIAGNOSIS — T451X5A Adverse effect of antineoplastic and immunosuppressive drugs, initial encounter: Secondary | ICD-10-CM | POA: Diagnosis not present

## 2020-06-01 DIAGNOSIS — Z808 Family history of malignant neoplasm of other organs or systems: Secondary | ICD-10-CM | POA: Diagnosis not present

## 2020-06-01 DIAGNOSIS — Z800 Family history of malignant neoplasm of digestive organs: Secondary | ICD-10-CM | POA: Diagnosis not present

## 2020-06-01 DIAGNOSIS — R112 Nausea with vomiting, unspecified: Secondary | ICD-10-CM | POA: Diagnosis not present

## 2020-06-01 DIAGNOSIS — Z86718 Personal history of other venous thrombosis and embolism: Secondary | ICD-10-CM | POA: Diagnosis not present

## 2020-06-01 DIAGNOSIS — Z95828 Presence of other vascular implants and grafts: Secondary | ICD-10-CM | POA: Diagnosis not present

## 2020-06-01 DIAGNOSIS — Z803 Family history of malignant neoplasm of breast: Secondary | ICD-10-CM | POA: Diagnosis not present

## 2020-06-01 DIAGNOSIS — Z7982 Long term (current) use of aspirin: Secondary | ICD-10-CM | POA: Diagnosis not present

## 2020-06-01 DIAGNOSIS — Z9012 Acquired absence of left breast and nipple: Secondary | ICD-10-CM | POA: Diagnosis not present

## 2020-06-01 DIAGNOSIS — Z7984 Long term (current) use of oral hypoglycemic drugs: Secondary | ICD-10-CM | POA: Diagnosis not present

## 2020-06-01 DIAGNOSIS — Z801 Family history of malignant neoplasm of trachea, bronchus and lung: Secondary | ICD-10-CM | POA: Diagnosis not present

## 2020-06-01 DIAGNOSIS — E119 Type 2 diabetes mellitus without complications: Secondary | ICD-10-CM | POA: Diagnosis not present

## 2020-06-01 DIAGNOSIS — D6959 Other secondary thrombocytopenia: Secondary | ICD-10-CM | POA: Diagnosis not present

## 2020-06-01 DIAGNOSIS — E876 Hypokalemia: Secondary | ICD-10-CM | POA: Diagnosis not present

## 2020-06-01 DIAGNOSIS — C182 Malignant neoplasm of ascending colon: Secondary | ICD-10-CM | POA: Diagnosis not present

## 2020-06-01 DIAGNOSIS — Z79899 Other long term (current) drug therapy: Secondary | ICD-10-CM | POA: Diagnosis not present

## 2020-06-01 DIAGNOSIS — Z9049 Acquired absence of other specified parts of digestive tract: Secondary | ICD-10-CM | POA: Diagnosis not present

## 2020-06-01 DIAGNOSIS — Z809 Family history of malignant neoplasm, unspecified: Secondary | ICD-10-CM | POA: Diagnosis not present

## 2020-06-01 DIAGNOSIS — Z853 Personal history of malignant neoplasm of breast: Secondary | ICD-10-CM | POA: Diagnosis not present

## 2020-06-03 DIAGNOSIS — Z452 Encounter for adjustment and management of vascular access device: Secondary | ICD-10-CM | POA: Diagnosis not present

## 2020-06-03 DIAGNOSIS — Z95828 Presence of other vascular implants and grafts: Secondary | ICD-10-CM | POA: Diagnosis not present

## 2020-06-03 DIAGNOSIS — C182 Malignant neoplasm of ascending colon: Secondary | ICD-10-CM | POA: Diagnosis not present

## 2020-06-13 DIAGNOSIS — I10 Essential (primary) hypertension: Secondary | ICD-10-CM | POA: Diagnosis not present

## 2020-06-13 DIAGNOSIS — C182 Malignant neoplasm of ascending colon: Secondary | ICD-10-CM | POA: Diagnosis not present

## 2020-06-15 DIAGNOSIS — R112 Nausea with vomiting, unspecified: Secondary | ICD-10-CM | POA: Diagnosis not present

## 2020-06-15 DIAGNOSIS — Z95828 Presence of other vascular implants and grafts: Secondary | ICD-10-CM | POA: Diagnosis not present

## 2020-06-15 DIAGNOSIS — E876 Hypokalemia: Secondary | ICD-10-CM | POA: Diagnosis not present

## 2020-06-15 DIAGNOSIS — Z809 Family history of malignant neoplasm, unspecified: Secondary | ICD-10-CM | POA: Diagnosis not present

## 2020-06-15 DIAGNOSIS — C182 Malignant neoplasm of ascending colon: Secondary | ICD-10-CM | POA: Diagnosis not present

## 2020-06-15 DIAGNOSIS — D6959 Other secondary thrombocytopenia: Secondary | ICD-10-CM | POA: Diagnosis not present

## 2020-06-15 DIAGNOSIS — Z79899 Other long term (current) drug therapy: Secondary | ICD-10-CM | POA: Diagnosis not present

## 2020-06-15 DIAGNOSIS — L089 Local infection of the skin and subcutaneous tissue, unspecified: Secondary | ICD-10-CM | POA: Diagnosis not present

## 2020-06-15 DIAGNOSIS — Z853 Personal history of malignant neoplasm of breast: Secondary | ICD-10-CM | POA: Diagnosis not present

## 2020-06-15 DIAGNOSIS — T451X5A Adverse effect of antineoplastic and immunosuppressive drugs, initial encounter: Secondary | ICD-10-CM | POA: Diagnosis not present

## 2020-06-15 DIAGNOSIS — Z5111 Encounter for antineoplastic chemotherapy: Secondary | ICD-10-CM | POA: Diagnosis not present

## 2020-06-17 DIAGNOSIS — Z452 Encounter for adjustment and management of vascular access device: Secondary | ICD-10-CM | POA: Diagnosis not present

## 2020-06-17 DIAGNOSIS — Z95828 Presence of other vascular implants and grafts: Secondary | ICD-10-CM | POA: Diagnosis not present

## 2020-06-17 DIAGNOSIS — C182 Malignant neoplasm of ascending colon: Secondary | ICD-10-CM | POA: Diagnosis not present

## 2020-06-29 DIAGNOSIS — D6959 Other secondary thrombocytopenia: Secondary | ICD-10-CM | POA: Diagnosis not present

## 2020-06-29 DIAGNOSIS — Z5111 Encounter for antineoplastic chemotherapy: Secondary | ICD-10-CM | POA: Diagnosis not present

## 2020-06-29 DIAGNOSIS — Z9012 Acquired absence of left breast and nipple: Secondary | ICD-10-CM | POA: Diagnosis not present

## 2020-06-29 DIAGNOSIS — R112 Nausea with vomiting, unspecified: Secondary | ICD-10-CM | POA: Diagnosis not present

## 2020-06-29 DIAGNOSIS — Z95828 Presence of other vascular implants and grafts: Secondary | ICD-10-CM | POA: Diagnosis not present

## 2020-06-29 DIAGNOSIS — Z79899 Other long term (current) drug therapy: Secondary | ICD-10-CM | POA: Diagnosis not present

## 2020-06-29 DIAGNOSIS — E876 Hypokalemia: Secondary | ICD-10-CM | POA: Diagnosis not present

## 2020-06-29 DIAGNOSIS — F17200 Nicotine dependence, unspecified, uncomplicated: Secondary | ICD-10-CM | POA: Diagnosis not present

## 2020-06-29 DIAGNOSIS — K521 Toxic gastroenteritis and colitis: Secondary | ICD-10-CM | POA: Diagnosis not present

## 2020-06-29 DIAGNOSIS — Z7984 Long term (current) use of oral hypoglycemic drugs: Secondary | ICD-10-CM | POA: Diagnosis not present

## 2020-06-29 DIAGNOSIS — Z9049 Acquired absence of other specified parts of digestive tract: Secondary | ICD-10-CM | POA: Diagnosis not present

## 2020-06-29 DIAGNOSIS — E119 Type 2 diabetes mellitus without complications: Secondary | ICD-10-CM | POA: Diagnosis not present

## 2020-06-29 DIAGNOSIS — Z853 Personal history of malignant neoplasm of breast: Secondary | ICD-10-CM | POA: Diagnosis not present

## 2020-06-29 DIAGNOSIS — C182 Malignant neoplasm of ascending colon: Secondary | ICD-10-CM | POA: Diagnosis not present

## 2020-07-01 DIAGNOSIS — Z9012 Acquired absence of left breast and nipple: Secondary | ICD-10-CM | POA: Diagnosis not present

## 2020-07-01 DIAGNOSIS — Z9049 Acquired absence of other specified parts of digestive tract: Secondary | ICD-10-CM | POA: Diagnosis not present

## 2020-07-01 DIAGNOSIS — D6959 Other secondary thrombocytopenia: Secondary | ICD-10-CM | POA: Diagnosis not present

## 2020-07-01 DIAGNOSIS — F17200 Nicotine dependence, unspecified, uncomplicated: Secondary | ICD-10-CM | POA: Diagnosis not present

## 2020-07-01 DIAGNOSIS — Z853 Personal history of malignant neoplasm of breast: Secondary | ICD-10-CM | POA: Diagnosis not present

## 2020-07-01 DIAGNOSIS — K521 Toxic gastroenteritis and colitis: Secondary | ICD-10-CM | POA: Diagnosis not present

## 2020-07-01 DIAGNOSIS — E876 Hypokalemia: Secondary | ICD-10-CM | POA: Diagnosis not present

## 2020-07-01 DIAGNOSIS — Z79899 Other long term (current) drug therapy: Secondary | ICD-10-CM | POA: Diagnosis not present

## 2020-07-01 DIAGNOSIS — E119 Type 2 diabetes mellitus without complications: Secondary | ICD-10-CM | POA: Diagnosis not present

## 2020-07-01 DIAGNOSIS — Z5111 Encounter for antineoplastic chemotherapy: Secondary | ICD-10-CM | POA: Diagnosis not present

## 2020-07-01 DIAGNOSIS — Z7984 Long term (current) use of oral hypoglycemic drugs: Secondary | ICD-10-CM | POA: Diagnosis not present

## 2020-07-01 DIAGNOSIS — C182 Malignant neoplasm of ascending colon: Secondary | ICD-10-CM | POA: Diagnosis not present

## 2020-07-01 DIAGNOSIS — R112 Nausea with vomiting, unspecified: Secondary | ICD-10-CM | POA: Diagnosis not present

## 2020-07-13 DIAGNOSIS — C182 Malignant neoplasm of ascending colon: Secondary | ICD-10-CM | POA: Diagnosis not present

## 2020-07-13 DIAGNOSIS — K521 Toxic gastroenteritis and colitis: Secondary | ICD-10-CM | POA: Diagnosis not present

## 2020-07-13 DIAGNOSIS — Z95828 Presence of other vascular implants and grafts: Secondary | ICD-10-CM | POA: Diagnosis not present

## 2020-07-13 DIAGNOSIS — Z853 Personal history of malignant neoplasm of breast: Secondary | ICD-10-CM | POA: Diagnosis not present

## 2020-07-13 DIAGNOSIS — Z5111 Encounter for antineoplastic chemotherapy: Secondary | ICD-10-CM | POA: Diagnosis not present

## 2020-07-13 DIAGNOSIS — T451X5A Adverse effect of antineoplastic and immunosuppressive drugs, initial encounter: Secondary | ICD-10-CM | POA: Diagnosis not present

## 2020-07-13 DIAGNOSIS — D6959 Other secondary thrombocytopenia: Secondary | ICD-10-CM | POA: Diagnosis not present

## 2020-07-13 DIAGNOSIS — E876 Hypokalemia: Secondary | ICD-10-CM | POA: Diagnosis not present

## 2020-07-15 DIAGNOSIS — Z9049 Acquired absence of other specified parts of digestive tract: Secondary | ICD-10-CM | POA: Diagnosis not present

## 2020-07-15 DIAGNOSIS — Z7984 Long term (current) use of oral hypoglycemic drugs: Secondary | ICD-10-CM | POA: Diagnosis not present

## 2020-07-15 DIAGNOSIS — D6959 Other secondary thrombocytopenia: Secondary | ICD-10-CM | POA: Diagnosis not present

## 2020-07-15 DIAGNOSIS — Z853 Personal history of malignant neoplasm of breast: Secondary | ICD-10-CM | POA: Diagnosis not present

## 2020-07-15 DIAGNOSIS — Z79899 Other long term (current) drug therapy: Secondary | ICD-10-CM | POA: Diagnosis not present

## 2020-07-15 DIAGNOSIS — R112 Nausea with vomiting, unspecified: Secondary | ICD-10-CM | POA: Diagnosis not present

## 2020-07-15 DIAGNOSIS — Z9012 Acquired absence of left breast and nipple: Secondary | ICD-10-CM | POA: Diagnosis not present

## 2020-07-15 DIAGNOSIS — C182 Malignant neoplasm of ascending colon: Secondary | ICD-10-CM | POA: Diagnosis not present

## 2020-07-15 DIAGNOSIS — E119 Type 2 diabetes mellitus without complications: Secondary | ICD-10-CM | POA: Diagnosis not present

## 2020-07-15 DIAGNOSIS — K521 Toxic gastroenteritis and colitis: Secondary | ICD-10-CM | POA: Diagnosis not present

## 2020-07-15 DIAGNOSIS — F17200 Nicotine dependence, unspecified, uncomplicated: Secondary | ICD-10-CM | POA: Diagnosis not present

## 2020-07-15 DIAGNOSIS — E876 Hypokalemia: Secondary | ICD-10-CM | POA: Diagnosis not present

## 2020-07-15 DIAGNOSIS — Z5111 Encounter for antineoplastic chemotherapy: Secondary | ICD-10-CM | POA: Diagnosis not present

## 2020-07-27 DIAGNOSIS — Z800 Family history of malignant neoplasm of digestive organs: Secondary | ICD-10-CM | POA: Diagnosis not present

## 2020-07-27 DIAGNOSIS — Z95828 Presence of other vascular implants and grafts: Secondary | ICD-10-CM | POA: Diagnosis not present

## 2020-07-27 DIAGNOSIS — Z79899 Other long term (current) drug therapy: Secondary | ICD-10-CM | POA: Diagnosis not present

## 2020-07-27 DIAGNOSIS — Z7984 Long term (current) use of oral hypoglycemic drugs: Secondary | ICD-10-CM | POA: Diagnosis not present

## 2020-07-27 DIAGNOSIS — Z808 Family history of malignant neoplasm of other organs or systems: Secondary | ICD-10-CM | POA: Diagnosis not present

## 2020-07-27 DIAGNOSIS — Z5111 Encounter for antineoplastic chemotherapy: Secondary | ICD-10-CM | POA: Diagnosis not present

## 2020-07-27 DIAGNOSIS — Z86718 Personal history of other venous thrombosis and embolism: Secondary | ICD-10-CM | POA: Diagnosis not present

## 2020-07-27 DIAGNOSIS — R42 Dizziness and giddiness: Secondary | ICD-10-CM | POA: Diagnosis not present

## 2020-07-27 DIAGNOSIS — Z803 Family history of malignant neoplasm of breast: Secondary | ICD-10-CM | POA: Diagnosis not present

## 2020-07-27 DIAGNOSIS — Z5181 Encounter for therapeutic drug level monitoring: Secondary | ICD-10-CM | POA: Diagnosis not present

## 2020-07-27 DIAGNOSIS — D6959 Other secondary thrombocytopenia: Secondary | ICD-10-CM | POA: Diagnosis not present

## 2020-07-27 DIAGNOSIS — E119 Type 2 diabetes mellitus without complications: Secondary | ICD-10-CM | POA: Diagnosis not present

## 2020-07-27 DIAGNOSIS — T451X5A Adverse effect of antineoplastic and immunosuppressive drugs, initial encounter: Secondary | ICD-10-CM | POA: Diagnosis not present

## 2020-07-27 DIAGNOSIS — K521 Toxic gastroenteritis and colitis: Secondary | ICD-10-CM | POA: Diagnosis not present

## 2020-07-27 DIAGNOSIS — Z801 Family history of malignant neoplasm of trachea, bronchus and lung: Secondary | ICD-10-CM | POA: Diagnosis not present

## 2020-07-27 DIAGNOSIS — Z9049 Acquired absence of other specified parts of digestive tract: Secondary | ICD-10-CM | POA: Diagnosis not present

## 2020-07-27 DIAGNOSIS — Z853 Personal history of malignant neoplasm of breast: Secondary | ICD-10-CM | POA: Diagnosis not present

## 2020-07-27 DIAGNOSIS — E876 Hypokalemia: Secondary | ICD-10-CM | POA: Diagnosis not present

## 2020-07-27 DIAGNOSIS — F17200 Nicotine dependence, unspecified, uncomplicated: Secondary | ICD-10-CM | POA: Diagnosis not present

## 2020-07-27 DIAGNOSIS — Z7982 Long term (current) use of aspirin: Secondary | ICD-10-CM | POA: Diagnosis not present

## 2020-07-27 DIAGNOSIS — C182 Malignant neoplasm of ascending colon: Secondary | ICD-10-CM | POA: Diagnosis not present

## 2020-07-27 DIAGNOSIS — Z9012 Acquired absence of left breast and nipple: Secondary | ICD-10-CM | POA: Diagnosis not present

## 2020-07-29 DIAGNOSIS — Z452 Encounter for adjustment and management of vascular access device: Secondary | ICD-10-CM | POA: Diagnosis not present

## 2020-07-29 DIAGNOSIS — Z95828 Presence of other vascular implants and grafts: Secondary | ICD-10-CM | POA: Diagnosis not present

## 2020-07-29 DIAGNOSIS — C182 Malignant neoplasm of ascending colon: Secondary | ICD-10-CM | POA: Diagnosis not present

## 2020-08-10 DIAGNOSIS — E876 Hypokalemia: Secondary | ICD-10-CM | POA: Diagnosis not present

## 2020-08-10 DIAGNOSIS — Z5181 Encounter for therapeutic drug level monitoring: Secondary | ICD-10-CM | POA: Diagnosis not present

## 2020-08-10 DIAGNOSIS — K521 Toxic gastroenteritis and colitis: Secondary | ICD-10-CM | POA: Diagnosis not present

## 2020-08-10 DIAGNOSIS — R42 Dizziness and giddiness: Secondary | ICD-10-CM | POA: Diagnosis not present

## 2020-08-10 DIAGNOSIS — Z9012 Acquired absence of left breast and nipple: Secondary | ICD-10-CM | POA: Diagnosis not present

## 2020-08-10 DIAGNOSIS — Z7982 Long term (current) use of aspirin: Secondary | ICD-10-CM | POA: Diagnosis not present

## 2020-08-10 DIAGNOSIS — Z79899 Other long term (current) drug therapy: Secondary | ICD-10-CM | POA: Diagnosis not present

## 2020-08-10 DIAGNOSIS — C182 Malignant neoplasm of ascending colon: Secondary | ICD-10-CM | POA: Diagnosis not present

## 2020-08-10 DIAGNOSIS — E119 Type 2 diabetes mellitus without complications: Secondary | ICD-10-CM | POA: Diagnosis not present

## 2020-08-10 DIAGNOSIS — Z853 Personal history of malignant neoplasm of breast: Secondary | ICD-10-CM | POA: Diagnosis not present

## 2020-08-10 DIAGNOSIS — Z5111 Encounter for antineoplastic chemotherapy: Secondary | ICD-10-CM | POA: Diagnosis not present

## 2020-08-10 DIAGNOSIS — Z7984 Long term (current) use of oral hypoglycemic drugs: Secondary | ICD-10-CM | POA: Diagnosis not present

## 2020-08-10 DIAGNOSIS — Z9049 Acquired absence of other specified parts of digestive tract: Secondary | ICD-10-CM | POA: Diagnosis not present

## 2020-08-10 DIAGNOSIS — Z801 Family history of malignant neoplasm of trachea, bronchus and lung: Secondary | ICD-10-CM | POA: Diagnosis not present

## 2020-08-10 DIAGNOSIS — Z800 Family history of malignant neoplasm of digestive organs: Secondary | ICD-10-CM | POA: Diagnosis not present

## 2020-08-10 DIAGNOSIS — Z95828 Presence of other vascular implants and grafts: Secondary | ICD-10-CM | POA: Diagnosis not present

## 2020-08-10 DIAGNOSIS — D6959 Other secondary thrombocytopenia: Secondary | ICD-10-CM | POA: Diagnosis not present

## 2020-08-10 DIAGNOSIS — Z803 Family history of malignant neoplasm of breast: Secondary | ICD-10-CM | POA: Diagnosis not present

## 2020-08-10 DIAGNOSIS — T451X5A Adverse effect of antineoplastic and immunosuppressive drugs, initial encounter: Secondary | ICD-10-CM | POA: Diagnosis not present

## 2020-08-10 DIAGNOSIS — Z86718 Personal history of other venous thrombosis and embolism: Secondary | ICD-10-CM | POA: Diagnosis not present

## 2020-08-10 DIAGNOSIS — F17200 Nicotine dependence, unspecified, uncomplicated: Secondary | ICD-10-CM | POA: Diagnosis not present

## 2020-08-10 DIAGNOSIS — Z808 Family history of malignant neoplasm of other organs or systems: Secondary | ICD-10-CM | POA: Diagnosis not present

## 2020-08-12 DIAGNOSIS — Z808 Family history of malignant neoplasm of other organs or systems: Secondary | ICD-10-CM | POA: Diagnosis not present

## 2020-08-12 DIAGNOSIS — D6959 Other secondary thrombocytopenia: Secondary | ICD-10-CM | POA: Diagnosis not present

## 2020-08-12 DIAGNOSIS — Z86718 Personal history of other venous thrombosis and embolism: Secondary | ICD-10-CM | POA: Diagnosis not present

## 2020-08-12 DIAGNOSIS — Z9012 Acquired absence of left breast and nipple: Secondary | ICD-10-CM | POA: Diagnosis not present

## 2020-08-12 DIAGNOSIS — Z7984 Long term (current) use of oral hypoglycemic drugs: Secondary | ICD-10-CM | POA: Diagnosis not present

## 2020-08-12 DIAGNOSIS — Z803 Family history of malignant neoplasm of breast: Secondary | ICD-10-CM | POA: Diagnosis not present

## 2020-08-12 DIAGNOSIS — Z5181 Encounter for therapeutic drug level monitoring: Secondary | ICD-10-CM | POA: Diagnosis not present

## 2020-08-12 DIAGNOSIS — Z800 Family history of malignant neoplasm of digestive organs: Secondary | ICD-10-CM | POA: Diagnosis not present

## 2020-08-12 DIAGNOSIS — K521 Toxic gastroenteritis and colitis: Secondary | ICD-10-CM | POA: Diagnosis not present

## 2020-08-12 DIAGNOSIS — Z7982 Long term (current) use of aspirin: Secondary | ICD-10-CM | POA: Diagnosis not present

## 2020-08-12 DIAGNOSIS — R42 Dizziness and giddiness: Secondary | ICD-10-CM | POA: Diagnosis not present

## 2020-08-12 DIAGNOSIS — E876 Hypokalemia: Secondary | ICD-10-CM | POA: Diagnosis not present

## 2020-08-12 DIAGNOSIS — T451X5A Adverse effect of antineoplastic and immunosuppressive drugs, initial encounter: Secondary | ICD-10-CM | POA: Diagnosis not present

## 2020-08-12 DIAGNOSIS — Z79899 Other long term (current) drug therapy: Secondary | ICD-10-CM | POA: Diagnosis not present

## 2020-08-12 DIAGNOSIS — Z853 Personal history of malignant neoplasm of breast: Secondary | ICD-10-CM | POA: Diagnosis not present

## 2020-08-12 DIAGNOSIS — F17200 Nicotine dependence, unspecified, uncomplicated: Secondary | ICD-10-CM | POA: Diagnosis not present

## 2020-08-12 DIAGNOSIS — Z5111 Encounter for antineoplastic chemotherapy: Secondary | ICD-10-CM | POA: Diagnosis not present

## 2020-08-12 DIAGNOSIS — Z9049 Acquired absence of other specified parts of digestive tract: Secondary | ICD-10-CM | POA: Diagnosis not present

## 2020-08-12 DIAGNOSIS — E119 Type 2 diabetes mellitus without complications: Secondary | ICD-10-CM | POA: Diagnosis not present

## 2020-08-12 DIAGNOSIS — C182 Malignant neoplasm of ascending colon: Secondary | ICD-10-CM | POA: Diagnosis not present

## 2020-08-12 DIAGNOSIS — Z801 Family history of malignant neoplasm of trachea, bronchus and lung: Secondary | ICD-10-CM | POA: Diagnosis not present

## 2020-08-15 DIAGNOSIS — I10 Essential (primary) hypertension: Secondary | ICD-10-CM | POA: Diagnosis not present

## 2020-08-15 DIAGNOSIS — E1165 Type 2 diabetes mellitus with hyperglycemia: Secondary | ICD-10-CM | POA: Diagnosis not present

## 2020-08-15 DIAGNOSIS — C182 Malignant neoplasm of ascending colon: Secondary | ICD-10-CM | POA: Diagnosis not present

## 2020-08-16 ENCOUNTER — Ambulatory Visit: Payer: Medicare Other | Attending: Internal Medicine

## 2020-08-16 DIAGNOSIS — Z23 Encounter for immunization: Secondary | ICD-10-CM

## 2020-08-16 NOTE — Progress Notes (Signed)
   Covid-19 Vaccination Clinic  Name:  Emily [name]    MRN: [id number] DOB: 23-Jan-1951  08/16/2020  [name] was observed post Covid-19 immunization for 15 minutes without incident. She was provided with Vaccine Information Sheet and instruction to access the V-Safe system.   [name] was instructed to call 911 with any severe reactions post vaccine: Marland Kitchen Difficulty breathing  . Swelling of face and throat  . A fast heartbeat  . A bad rash all over body  . Dizziness and weakness

## 2020-09-28 DIAGNOSIS — Z95828 Presence of other vascular implants and grafts: Secondary | ICD-10-CM | POA: Diagnosis not present

## 2020-09-28 DIAGNOSIS — C182 Malignant neoplasm of ascending colon: Secondary | ICD-10-CM | POA: Diagnosis not present

## 2020-09-28 DIAGNOSIS — Z452 Encounter for adjustment and management of vascular access device: Secondary | ICD-10-CM | POA: Diagnosis not present

## 2020-11-03 DIAGNOSIS — C182 Malignant neoplasm of ascending colon: Secondary | ICD-10-CM | POA: Diagnosis not present

## 2020-11-03 DIAGNOSIS — D3501 Benign neoplasm of right adrenal gland: Secondary | ICD-10-CM | POA: Diagnosis not present

## 2020-11-03 DIAGNOSIS — C189 Malignant neoplasm of colon, unspecified: Secondary | ICD-10-CM | POA: Diagnosis not present

## 2020-11-03 DIAGNOSIS — D3502 Benign neoplasm of left adrenal gland: Secondary | ICD-10-CM | POA: Diagnosis not present

## 2020-11-03 DIAGNOSIS — R918 Other nonspecific abnormal finding of lung field: Secondary | ICD-10-CM | POA: Diagnosis not present

## 2020-11-09 DIAGNOSIS — F17200 Nicotine dependence, unspecified, uncomplicated: Secondary | ICD-10-CM | POA: Diagnosis not present

## 2020-11-09 DIAGNOSIS — T451X5A Adverse effect of antineoplastic and immunosuppressive drugs, initial encounter: Secondary | ICD-10-CM | POA: Diagnosis not present

## 2020-11-09 DIAGNOSIS — Z809 Family history of malignant neoplasm, unspecified: Secondary | ICD-10-CM | POA: Diagnosis not present

## 2020-11-09 DIAGNOSIS — F17210 Nicotine dependence, cigarettes, uncomplicated: Secondary | ICD-10-CM | POA: Diagnosis not present

## 2020-11-09 DIAGNOSIS — R197 Diarrhea, unspecified: Secondary | ICD-10-CM | POA: Diagnosis not present

## 2020-11-09 DIAGNOSIS — G620 Drug-induced polyneuropathy: Secondary | ICD-10-CM | POA: Diagnosis not present

## 2020-11-09 DIAGNOSIS — C182 Malignant neoplasm of ascending colon: Secondary | ICD-10-CM | POA: Diagnosis not present

## 2020-11-09 DIAGNOSIS — Z95828 Presence of other vascular implants and grafts: Secondary | ICD-10-CM | POA: Diagnosis not present

## 2020-12-28 DIAGNOSIS — Z452 Encounter for adjustment and management of vascular access device: Secondary | ICD-10-CM | POA: Diagnosis not present

## 2020-12-28 DIAGNOSIS — C182 Malignant neoplasm of ascending colon: Secondary | ICD-10-CM | POA: Diagnosis not present

## 2020-12-28 DIAGNOSIS — Z95828 Presence of other vascular implants and grafts: Secondary | ICD-10-CM | POA: Diagnosis not present

## 2020-12-30 DIAGNOSIS — E1165 Type 2 diabetes mellitus with hyperglycemia: Secondary | ICD-10-CM | POA: Diagnosis not present

## 2020-12-30 DIAGNOSIS — Z0000 Encounter for general adult medical examination without abnormal findings: Secondary | ICD-10-CM | POA: Diagnosis not present

## 2020-12-30 DIAGNOSIS — C182 Malignant neoplasm of ascending colon: Secondary | ICD-10-CM | POA: Diagnosis not present

## 2020-12-30 DIAGNOSIS — I10 Essential (primary) hypertension: Secondary | ICD-10-CM | POA: Diagnosis not present

## 2020-12-30 DIAGNOSIS — W19XXXA Unspecified fall, initial encounter: Secondary | ICD-10-CM | POA: Diagnosis not present

## 2020-12-30 DIAGNOSIS — M792 Neuralgia and neuritis, unspecified: Secondary | ICD-10-CM | POA: Diagnosis not present

## 2021-01-19 DIAGNOSIS — M6281 Muscle weakness (generalized): Secondary | ICD-10-CM | POA: Diagnosis not present

## 2021-01-19 DIAGNOSIS — Z4789 Encounter for other orthopedic aftercare: Secondary | ICD-10-CM | POA: Diagnosis not present

## 2021-01-19 DIAGNOSIS — R2689 Other abnormalities of gait and mobility: Secondary | ICD-10-CM | POA: Diagnosis not present

## 2021-01-23 DIAGNOSIS — M6281 Muscle weakness (generalized): Secondary | ICD-10-CM | POA: Diagnosis not present

## 2021-01-23 DIAGNOSIS — R2689 Other abnormalities of gait and mobility: Secondary | ICD-10-CM | POA: Diagnosis not present

## 2021-01-23 DIAGNOSIS — Z4789 Encounter for other orthopedic aftercare: Secondary | ICD-10-CM | POA: Diagnosis not present

## 2021-01-27 DIAGNOSIS — M6281 Muscle weakness (generalized): Secondary | ICD-10-CM | POA: Diagnosis not present

## 2021-01-27 DIAGNOSIS — R2689 Other abnormalities of gait and mobility: Secondary | ICD-10-CM | POA: Diagnosis not present

## 2021-01-27 DIAGNOSIS — Z4789 Encounter for other orthopedic aftercare: Secondary | ICD-10-CM | POA: Diagnosis not present

## 2021-02-01 DIAGNOSIS — Z801 Family history of malignant neoplasm of trachea, bronchus and lung: Secondary | ICD-10-CM | POA: Diagnosis not present

## 2021-02-01 DIAGNOSIS — T451X5A Adverse effect of antineoplastic and immunosuppressive drugs, initial encounter: Secondary | ICD-10-CM | POA: Diagnosis not present

## 2021-02-01 DIAGNOSIS — Z808 Family history of malignant neoplasm of other organs or systems: Secondary | ICD-10-CM | POA: Diagnosis not present

## 2021-02-01 DIAGNOSIS — Z9012 Acquired absence of left breast and nipple: Secondary | ICD-10-CM | POA: Diagnosis not present

## 2021-02-01 DIAGNOSIS — F17210 Nicotine dependence, cigarettes, uncomplicated: Secondary | ICD-10-CM | POA: Diagnosis not present

## 2021-02-01 DIAGNOSIS — Z853 Personal history of malignant neoplasm of breast: Secondary | ICD-10-CM | POA: Diagnosis not present

## 2021-02-01 DIAGNOSIS — G620 Drug-induced polyneuropathy: Secondary | ICD-10-CM | POA: Diagnosis not present

## 2021-02-01 DIAGNOSIS — Z95828 Presence of other vascular implants and grafts: Secondary | ICD-10-CM | POA: Diagnosis not present

## 2021-02-01 DIAGNOSIS — Z85038 Personal history of other malignant neoplasm of large intestine: Secondary | ICD-10-CM | POA: Diagnosis not present

## 2021-02-01 DIAGNOSIS — Z7984 Long term (current) use of oral hypoglycemic drugs: Secondary | ICD-10-CM | POA: Diagnosis not present

## 2021-02-01 DIAGNOSIS — E119 Type 2 diabetes mellitus without complications: Secondary | ICD-10-CM | POA: Diagnosis not present

## 2021-02-01 DIAGNOSIS — C182 Malignant neoplasm of ascending colon: Secondary | ICD-10-CM | POA: Diagnosis not present

## 2021-02-01 DIAGNOSIS — Z800 Family history of malignant neoplasm of digestive organs: Secondary | ICD-10-CM | POA: Diagnosis not present

## 2021-02-01 DIAGNOSIS — R197 Diarrhea, unspecified: Secondary | ICD-10-CM | POA: Diagnosis not present

## 2021-02-01 DIAGNOSIS — K521 Toxic gastroenteritis and colitis: Secondary | ICD-10-CM | POA: Diagnosis not present

## 2021-02-01 DIAGNOSIS — Z803 Family history of malignant neoplasm of breast: Secondary | ICD-10-CM | POA: Diagnosis not present

## 2021-02-01 DIAGNOSIS — Z79899 Other long term (current) drug therapy: Secondary | ICD-10-CM | POA: Diagnosis not present

## 2021-02-06 DIAGNOSIS — M6281 Muscle weakness (generalized): Secondary | ICD-10-CM | POA: Diagnosis not present

## 2021-02-06 DIAGNOSIS — Z4789 Encounter for other orthopedic aftercare: Secondary | ICD-10-CM | POA: Diagnosis not present

## 2021-02-06 DIAGNOSIS — R2689 Other abnormalities of gait and mobility: Secondary | ICD-10-CM | POA: Diagnosis not present

## 2021-02-09 DIAGNOSIS — Z4789 Encounter for other orthopedic aftercare: Secondary | ICD-10-CM | POA: Diagnosis not present

## 2021-02-09 DIAGNOSIS — M6281 Muscle weakness (generalized): Secondary | ICD-10-CM | POA: Diagnosis not present

## 2021-02-09 DIAGNOSIS — R2689 Other abnormalities of gait and mobility: Secondary | ICD-10-CM | POA: Diagnosis not present

## 2021-02-13 DIAGNOSIS — M6281 Muscle weakness (generalized): Secondary | ICD-10-CM | POA: Diagnosis not present

## 2021-02-13 DIAGNOSIS — R2689 Other abnormalities of gait and mobility: Secondary | ICD-10-CM | POA: Diagnosis not present

## 2021-02-13 DIAGNOSIS — Z4789 Encounter for other orthopedic aftercare: Secondary | ICD-10-CM | POA: Diagnosis not present

## 2021-02-16 DIAGNOSIS — M6281 Muscle weakness (generalized): Secondary | ICD-10-CM | POA: Diagnosis not present

## 2021-02-16 DIAGNOSIS — R2689 Other abnormalities of gait and mobility: Secondary | ICD-10-CM | POA: Diagnosis not present

## 2021-02-16 DIAGNOSIS — Z4789 Encounter for other orthopedic aftercare: Secondary | ICD-10-CM | POA: Diagnosis not present

## 2021-02-20 DIAGNOSIS — R531 Weakness: Secondary | ICD-10-CM | POA: Diagnosis not present

## 2021-02-20 DIAGNOSIS — R2689 Other abnormalities of gait and mobility: Secondary | ICD-10-CM | POA: Diagnosis not present

## 2021-02-23 DIAGNOSIS — R531 Weakness: Secondary | ICD-10-CM | POA: Diagnosis not present

## 2021-02-23 DIAGNOSIS — R2689 Other abnormalities of gait and mobility: Secondary | ICD-10-CM | POA: Diagnosis not present

## 2021-02-27 DIAGNOSIS — R531 Weakness: Secondary | ICD-10-CM | POA: Diagnosis not present

## 2021-02-27 DIAGNOSIS — R2689 Other abnormalities of gait and mobility: Secondary | ICD-10-CM | POA: Diagnosis not present

## 2021-03-02 DIAGNOSIS — R2689 Other abnormalities of gait and mobility: Secondary | ICD-10-CM | POA: Diagnosis not present

## 2021-03-02 DIAGNOSIS — R531 Weakness: Secondary | ICD-10-CM | POA: Diagnosis not present

## 2021-03-08 DIAGNOSIS — R29898 Other symptoms and signs involving the musculoskeletal system: Secondary | ICD-10-CM | POA: Diagnosis not present

## 2021-03-08 DIAGNOSIS — R2689 Other abnormalities of gait and mobility: Secondary | ICD-10-CM | POA: Diagnosis not present

## 2021-03-15 DIAGNOSIS — R29898 Other symptoms and signs involving the musculoskeletal system: Secondary | ICD-10-CM | POA: Diagnosis not present

## 2021-03-15 DIAGNOSIS — R2689 Other abnormalities of gait and mobility: Secondary | ICD-10-CM | POA: Diagnosis not present

## 2021-03-22 DIAGNOSIS — R2689 Other abnormalities of gait and mobility: Secondary | ICD-10-CM | POA: Diagnosis not present

## 2021-03-29 DIAGNOSIS — C182 Malignant neoplasm of ascending colon: Secondary | ICD-10-CM | POA: Diagnosis not present

## 2021-03-29 DIAGNOSIS — Z95828 Presence of other vascular implants and grafts: Secondary | ICD-10-CM | POA: Diagnosis not present

## 2021-03-29 DIAGNOSIS — R2689 Other abnormalities of gait and mobility: Secondary | ICD-10-CM | POA: Diagnosis not present

## 2021-04-05 DIAGNOSIS — R531 Weakness: Secondary | ICD-10-CM | POA: Diagnosis not present

## 2021-04-05 DIAGNOSIS — Z9221 Personal history of antineoplastic chemotherapy: Secondary | ICD-10-CM | POA: Diagnosis not present

## 2021-04-05 DIAGNOSIS — R2689 Other abnormalities of gait and mobility: Secondary | ICD-10-CM | POA: Diagnosis not present

## 2021-04-05 DIAGNOSIS — G629 Polyneuropathy, unspecified: Secondary | ICD-10-CM | POA: Diagnosis not present

## 2021-04-12 DIAGNOSIS — Z9221 Personal history of antineoplastic chemotherapy: Secondary | ICD-10-CM | POA: Diagnosis not present

## 2021-04-12 DIAGNOSIS — G629 Polyneuropathy, unspecified: Secondary | ICD-10-CM | POA: Diagnosis not present

## 2021-04-12 DIAGNOSIS — R2689 Other abnormalities of gait and mobility: Secondary | ICD-10-CM | POA: Diagnosis not present

## 2021-04-12 DIAGNOSIS — R531 Weakness: Secondary | ICD-10-CM | POA: Diagnosis not present

## 2021-05-10 DIAGNOSIS — Z95828 Presence of other vascular implants and grafts: Secondary | ICD-10-CM | POA: Diagnosis not present

## 2021-05-10 DIAGNOSIS — C182 Malignant neoplasm of ascending colon: Secondary | ICD-10-CM | POA: Diagnosis not present

## 2021-06-05 DIAGNOSIS — Z853 Personal history of malignant neoplasm of breast: Secondary | ICD-10-CM | POA: Diagnosis not present

## 2021-06-05 DIAGNOSIS — F17210 Nicotine dependence, cigarettes, uncomplicated: Secondary | ICD-10-CM | POA: Diagnosis not present

## 2021-06-05 DIAGNOSIS — T451X5A Adverse effect of antineoplastic and immunosuppressive drugs, initial encounter: Secondary | ICD-10-CM | POA: Diagnosis not present

## 2021-06-05 DIAGNOSIS — Z95828 Presence of other vascular implants and grafts: Secondary | ICD-10-CM | POA: Diagnosis not present

## 2021-06-05 DIAGNOSIS — G620 Drug-induced polyneuropathy: Secondary | ICD-10-CM | POA: Diagnosis not present

## 2021-06-05 DIAGNOSIS — C182 Malignant neoplasm of ascending colon: Secondary | ICD-10-CM | POA: Diagnosis not present

## 2021-07-31 DIAGNOSIS — C182 Malignant neoplasm of ascending colon: Secondary | ICD-10-CM | POA: Diagnosis not present

## 2021-07-31 DIAGNOSIS — Z95828 Presence of other vascular implants and grafts: Secondary | ICD-10-CM | POA: Diagnosis not present

## 2022-01-10 DIAGNOSIS — C182 Malignant neoplasm of ascending colon: Secondary | ICD-10-CM | POA: Diagnosis not present

## 2022-01-10 DIAGNOSIS — I10 Essential (primary) hypertension: Secondary | ICD-10-CM | POA: Diagnosis not present

## 2022-01-10 DIAGNOSIS — G620 Drug-induced polyneuropathy: Secondary | ICD-10-CM | POA: Diagnosis not present

## 2022-01-10 DIAGNOSIS — T451X5A Adverse effect of antineoplastic and immunosuppressive drugs, initial encounter: Secondary | ICD-10-CM | POA: Diagnosis not present

## 2022-01-10 DIAGNOSIS — E278 Other specified disorders of adrenal gland: Secondary | ICD-10-CM | POA: Diagnosis not present

## 2022-01-10 DIAGNOSIS — M1990 Unspecified osteoarthritis, unspecified site: Secondary | ICD-10-CM | POA: Diagnosis not present

## 2022-01-10 DIAGNOSIS — Z0000 Encounter for general adult medical examination without abnormal findings: Secondary | ICD-10-CM | POA: Diagnosis not present

## 2022-01-10 DIAGNOSIS — H259 Unspecified age-related cataract: Secondary | ICD-10-CM | POA: Diagnosis not present

## 2022-01-10 DIAGNOSIS — E1165 Type 2 diabetes mellitus with hyperglycemia: Secondary | ICD-10-CM | POA: Diagnosis not present

## 2022-01-10 DIAGNOSIS — F17210 Nicotine dependence, cigarettes, uncomplicated: Secondary | ICD-10-CM | POA: Diagnosis not present

## 2022-01-19 DIAGNOSIS — I10 Essential (primary) hypertension: Secondary | ICD-10-CM | POA: Diagnosis not present

## 2022-01-19 DIAGNOSIS — E1165 Type 2 diabetes mellitus with hyperglycemia: Secondary | ICD-10-CM | POA: Diagnosis not present

## 2022-01-19 DIAGNOSIS — C182 Malignant neoplasm of ascending colon: Secondary | ICD-10-CM | POA: Diagnosis not present

## 2022-01-19 DIAGNOSIS — L309 Dermatitis, unspecified: Secondary | ICD-10-CM | POA: Diagnosis not present

## 2022-01-31 DIAGNOSIS — Z853 Personal history of malignant neoplasm of breast: Secondary | ICD-10-CM | POA: Diagnosis not present

## 2022-01-31 DIAGNOSIS — Z95828 Presence of other vascular implants and grafts: Secondary | ICD-10-CM | POA: Diagnosis not present

## 2022-01-31 DIAGNOSIS — T451X5A Adverse effect of antineoplastic and immunosuppressive drugs, initial encounter: Secondary | ICD-10-CM | POA: Diagnosis not present

## 2022-01-31 DIAGNOSIS — F17210 Nicotine dependence, cigarettes, uncomplicated: Secondary | ICD-10-CM | POA: Diagnosis not present

## 2022-01-31 DIAGNOSIS — R197 Diarrhea, unspecified: Secondary | ICD-10-CM | POA: Diagnosis not present

## 2022-01-31 DIAGNOSIS — C182 Malignant neoplasm of ascending colon: Secondary | ICD-10-CM | POA: Diagnosis not present

## 2022-01-31 DIAGNOSIS — G620 Drug-induced polyneuropathy: Secondary | ICD-10-CM | POA: Diagnosis not present

## 2022-02-26 DIAGNOSIS — M205X1 Other deformities of toe(s) (acquired), right foot: Secondary | ICD-10-CM | POA: Diagnosis not present

## 2022-02-26 DIAGNOSIS — I10 Essential (primary) hypertension: Secondary | ICD-10-CM | POA: Diagnosis not present

## 2022-02-26 DIAGNOSIS — C182 Malignant neoplasm of ascending colon: Secondary | ICD-10-CM | POA: Diagnosis not present

## 2022-02-26 DIAGNOSIS — M2042 Other hammer toe(s) (acquired), left foot: Secondary | ICD-10-CM | POA: Diagnosis not present

## 2022-02-26 DIAGNOSIS — E1142 Type 2 diabetes mellitus with diabetic polyneuropathy: Secondary | ICD-10-CM | POA: Diagnosis not present

## 2022-02-26 DIAGNOSIS — B351 Tinea unguium: Secondary | ICD-10-CM | POA: Diagnosis not present

## 2022-02-26 DIAGNOSIS — M2041 Other hammer toe(s) (acquired), right foot: Secondary | ICD-10-CM | POA: Diagnosis not present

## 2024-03-05 DIAGNOSIS — Z1231 Encounter for screening mammogram for malignant neoplasm of breast: Secondary | ICD-10-CM | POA: Diagnosis not present

## 2024-03-05 DIAGNOSIS — R92323 Mammographic fibroglandular density, bilateral breasts: Secondary | ICD-10-CM | POA: Diagnosis not present

## 2024-03-25 DIAGNOSIS — E1165 Type 2 diabetes mellitus with hyperglycemia: Secondary | ICD-10-CM | POA: Diagnosis not present

## 2024-03-25 DIAGNOSIS — R911 Solitary pulmonary nodule: Secondary | ICD-10-CM | POA: Diagnosis not present

## 2024-03-25 DIAGNOSIS — F17210 Nicotine dependence, cigarettes, uncomplicated: Secondary | ICD-10-CM | POA: Diagnosis not present

## 2024-03-25 DIAGNOSIS — Z0000 Encounter for general adult medical examination without abnormal findings: Secondary | ICD-10-CM | POA: Diagnosis not present

## 2024-03-25 DIAGNOSIS — C182 Malignant neoplasm of ascending colon: Secondary | ICD-10-CM | POA: Diagnosis not present

## 2024-03-25 DIAGNOSIS — I10 Essential (primary) hypertension: Secondary | ICD-10-CM | POA: Diagnosis not present

## 2024-03-25 DIAGNOSIS — H35033 Hypertensive retinopathy, bilateral: Secondary | ICD-10-CM | POA: Diagnosis not present

## 2024-03-26 DIAGNOSIS — D125 Benign neoplasm of sigmoid colon: Secondary | ICD-10-CM | POA: Diagnosis not present

## 2024-03-26 DIAGNOSIS — K635 Polyp of colon: Secondary | ICD-10-CM | POA: Diagnosis not present

## 2024-03-26 DIAGNOSIS — Z08 Encounter for follow-up examination after completed treatment for malignant neoplasm: Secondary | ICD-10-CM | POA: Diagnosis not present

## 2024-03-26 DIAGNOSIS — Z1211 Encounter for screening for malignant neoplasm of colon: Secondary | ICD-10-CM | POA: Diagnosis not present

## 2024-03-26 DIAGNOSIS — D128 Benign neoplasm of rectum: Secondary | ICD-10-CM | POA: Diagnosis not present

## 2024-03-26 DIAGNOSIS — K621 Rectal polyp: Secondary | ICD-10-CM | POA: Diagnosis not present

## 2024-03-26 DIAGNOSIS — Z85038 Personal history of other malignant neoplasm of large intestine: Secondary | ICD-10-CM | POA: Diagnosis not present

## 2024-04-28 DIAGNOSIS — C182 Malignant neoplasm of ascending colon: Secondary | ICD-10-CM | POA: Diagnosis not present

## 2024-04-28 DIAGNOSIS — I10 Essential (primary) hypertension: Secondary | ICD-10-CM | POA: Diagnosis not present

## 2024-04-28 DIAGNOSIS — N3000 Acute cystitis without hematuria: Secondary | ICD-10-CM | POA: Diagnosis not present

## 2024-04-28 DIAGNOSIS — R319 Hematuria, unspecified: Secondary | ICD-10-CM | POA: Diagnosis not present

## 2024-04-28 DIAGNOSIS — R82998 Other abnormal findings in urine: Secondary | ICD-10-CM | POA: Diagnosis not present

## 2024-05-08 DIAGNOSIS — E1142 Type 2 diabetes mellitus with diabetic polyneuropathy: Secondary | ICD-10-CM | POA: Diagnosis not present

## 2024-05-08 DIAGNOSIS — L851 Acquired keratosis [keratoderma] palmaris et plantaris: Secondary | ICD-10-CM | POA: Diagnosis not present

## 2024-05-08 DIAGNOSIS — B351 Tinea unguium: Secondary | ICD-10-CM | POA: Diagnosis not present
